# Patient Record
Sex: Male | Born: 1960 | Race: Black or African American | Hispanic: No | Marital: Single | Smoking: Former smoker
Health system: Southern US, Community
[De-identification: ages and names within clinical notes are randomized; demographics above are authoritative.]

## PROBLEM LIST (undated history)

## (undated) DIAGNOSIS — M1990 Unspecified osteoarthritis, unspecified site: Secondary | ICD-10-CM

## (undated) DIAGNOSIS — S79919A Unspecified injury of unspecified hip, initial encounter: Secondary | ICD-10-CM

---

## 1998-08-23 ENCOUNTER — Emergency Department (HOSPITAL_COMMUNITY): Admission: EM | Admit: 1998-08-23 | Discharge: 1998-08-23 | Payer: Self-pay | Admitting: Emergency Medicine

## 1998-08-23 ENCOUNTER — Encounter: Payer: Self-pay | Admitting: Emergency Medicine

## 1999-07-10 ENCOUNTER — Emergency Department (HOSPITAL_COMMUNITY): Admission: EM | Admit: 1999-07-10 | Discharge: 1999-07-10 | Payer: Self-pay | Admitting: Emergency Medicine

## 2009-08-26 ENCOUNTER — Emergency Department (HOSPITAL_COMMUNITY): Admission: EM | Admit: 2009-08-26 | Discharge: 2009-08-26 | Payer: Self-pay | Admitting: Emergency Medicine

## 2016-03-30 ENCOUNTER — Encounter (HOSPITAL_COMMUNITY): Payer: Self-pay

## 2016-03-30 ENCOUNTER — Emergency Department (HOSPITAL_COMMUNITY)
Admission: EM | Admit: 2016-03-30 | Discharge: 2016-03-31 | Disposition: A | Discharge status: Home / Self Care | Payer: Commercial Managed Care - HMO | Attending: Emergency Medicine | Admitting: Emergency Medicine

## 2016-03-30 DIAGNOSIS — I10 Essential (primary) hypertension: Secondary | ICD-10-CM | POA: Diagnosis not present

## 2016-03-30 DIAGNOSIS — R079 Chest pain, unspecified: Secondary | ICD-10-CM | POA: Diagnosis not present

## 2016-03-30 DIAGNOSIS — Z87891 Personal history of nicotine dependence: Secondary | ICD-10-CM | POA: Insufficient documentation

## 2016-03-30 HISTORY — DX: Unspecified osteoarthritis, unspecified site: M19.90

## 2016-03-30 LAB — I-STAT CHEM 8, ED
BUN: 17 mg/dL (ref 6–20)
Calcium, Ion: 1.14 mmol/L — ABNORMAL LOW (ref 1.15–1.40)
Chloride: 102 mmol/L (ref 101–111)
Creatinine, Ser: 1 mg/dL (ref 0.61–1.24)
Glucose, Bld: 87 mg/dL (ref 65–99)
HEMATOCRIT: 44 % (ref 39.0–52.0)
HEMOGLOBIN: 15 g/dL (ref 13.0–17.0)
POTASSIUM: 3.7 mmol/L (ref 3.5–5.1)
SODIUM: 141 mmol/L (ref 135–145)
TCO2: 28 mmol/L (ref 0–100)

## 2016-03-30 LAB — I-STAT TROPONIN, ED: TROPONIN I, POC: 0 ng/mL (ref 0.00–0.08)

## 2016-03-30 MED ORDER — HYDROCHLOROTHIAZIDE 25 MG PO TABS: 25.0000 mg | ORAL_TABLET | Freq: Every day | ORAL | 3 refills | Status: DC

## 2016-03-30 NOTE — ED Notes (Signed)
ED Provider at bedside. 

## 2016-03-30 NOTE — ED Provider Notes (Signed)
[facility] Provider Note   CSN: [id number] Arrival date & time: 03/30/16  1804     History   Chief Complaint Chief Complaint  Patient presents with  . Chest Pain    HPI Gary Velasquez is a 56 y.o. male.  Patient with follow-up visit with a primary care doctor for 2 concerns. Patient's been having difficulty with upper shoulder and neck pain that radiates into both arms for 2 months. Made worse with movement. If he still he has no pain. Patient works 2 jobs. Never had any anterior chest pain. He's noted though his blood pressures been elevated for over 3 weeks. He thinks is due to the prednisone was started on 4 the shoulder pain. Patient was at the doctor's office mostly for concern for the elevated blood pressure they noted EKG changes of ST segment elevation inferiorly and anterolaterally and patient was referred in. Patient's had no anterior chest pain no new pain any different families had for the past 2 months in the upper shoulder area. No shortness of breath no nausea no vomiting. Occasionally has some numbness in the arms.      Past Medical History:  Diagnosis Date  . Arthritis     There are no active problems to display for this patient.   History reviewed. No pertinent surgical history.     Home Medications    Prior to Admission medications   Medication Sig Start Date End Date Taking? Authorizing Provider  chlorhexidine (PERIDEX) 0.12 % solution 15 mLs by Mouth Rinse route every other day.  03/19/16  Yes Historical Provider, MD  etodolac (LODINE) 400 MG tablet Take 400 mg by mouth 2 (two) times daily. 03/03/16  Yes Historical Provider, MD  pantoprazole (PROTONIX) 40 MG tablet Take 40 mg by mouth daily before breakfast. 03/19/16  Yes Historical Provider, MD  predniSONE (DELTASONE) 10 MG tablet Take 10 mg by mouth daily. 03/18/16  Yes Historical Provider, MD  PREVIDENT 5000 BOOSTER PLUS 1.1 % PSTE 1 application daily.  01/20/16  Yes Historical Provider, MD    trolamine salicylate (ASPERCREME) 10 % cream Apply 1-2 application topically See admin instructions. Daily to painful sites as needed   Yes Historical Provider, MD  hydrochlorothiazide (HYDRODIURIL) 25 MG tablet Take 1 tablet (25 mg total) by mouth daily. 03/30/16   Vanetta Mulders, MD    Family History No family history on file.  Social History Social History  Substance Use Topics  . Smoking status: Former Smoker    Packs/day: 0.50    Years: 20.00    Types: Cigarettes    Quit date: 03/25/2016  . Smokeless tobacco: Current User  . Alcohol use No     Allergies   Patient has no known allergies.   Review of Systems Review of Systems  Constitutional: Negative for fever.  HENT: Negative for congestion.   Eyes: Negative for visual disturbance.  Respiratory: Negative for shortness of breath.   Cardiovascular: Negative for chest pain.  Gastrointestinal: Negative for abdominal pain.  Genitourinary: Negative for dysuria.  Musculoskeletal: Positive for back pain and neck pain.  Skin: Negative for rash.  Neurological: Positive for numbness. Negative for dizziness, weakness and headaches.  Hematological: Does not bruise/bleed easily.  Psychiatric/Behavioral: Negative for confusion.     Physical Exam Updated Vital Signs BP 140/98   Pulse 63   Resp 16   Ht 5\' 8"  (1.727 m)   Wt 76.9 kg   SpO2 100%   BMI 25.77 kg/m   Physical Exam  Constitutional: He appears well-developed and well-nourished. No distress.  HENT:  Head: Normocephalic and atraumatic.  Mouth/Throat: Oropharynx is clear and moist.  Eyes: Conjunctivae and EOM are normal. Pupils are equal, round, and reactive to light.  Neck: Normal range of motion. Neck supple.  Cardiovascular: Normal rate, regular rhythm and normal heart sounds.   Pulmonary/Chest: Effort normal and breath sounds normal. No respiratory distress.  Abdominal: Soft. Bowel sounds are normal. There is no tenderness.  Musculoskeletal: Normal range  of motion. He exhibits no edema.  Neurological: He is alert. No cranial nerve deficit or sensory deficit. He exhibits normal muscle tone. Coordination normal.  Skin: Skin is warm.  Nursing note and vitals reviewed.    ED Treatments / Results  Labs (all labs ordered are listed, but only abnormal results are displayed) Labs Reviewed  I-STAT CHEM 8, ED - Abnormal; Notable for the following:       Result Value   Calcium, Ion 1.14 (*)    All other components within normal limits  I-STAT TROPOININ, ED    EKG  EKG Interpretation  Date/Time:  Tuesday March 30 2016 18:13:30 EST Ventricular Rate:  62 PR Interval:    QRS Duration: 90 QT Interval:  385 QTC Calculation: 391 R Axis:   31 Text Interpretation:  Sinus rhythm Borderline ST elevation, anterolateral leads No previous ECGs available Confirmed by Da Michelle  MD, Efrain Clauson ([id number]) on 03/30/2016 6:28:52 PM       Radiology No results found.  Procedures Procedures (including critical care time)  Medications Ordered in ED Medications - No data to display   Initial Impression / Assessment and Plan / ED Course  I have reviewed the triage vital signs and the nursing notes.  Pertinent labs & imaging results that were available during my care of the patient were reviewed by me and considered in my medical decision making (see chart for details).  Clinical Course     EKG was some ST segment elevation. Troponin here negative. Patient's been having upper shoulder chest discomfort for 2 months now. No worsening of the symptoms. With troponin being negative no concerns for an acute cardiac event.  Patient's blood pressure here is been elevated patient reports historically it's been elevated for about 3 weeks. Will start on of hydrochlorothiazide him follow-up with his primary care doctor. Also recommend starting taking a baby aspirin a day and following up with cardiology.  Continue your current medications.  Final Clinical  Impressions(s) / ED Diagnoses   Final diagnoses:  Essential hypertension    New Prescriptions New Prescriptions   HYDROCHLOROTHIAZIDE (HYDRODIURIL) 25 MG TABLET    Take 1 tablet (25 mg total) by mouth daily.     Vanetta MuldersScott Miliana Gangwer, MD 03/30/16 2020

## 2016-03-30 NOTE — Discharge Instructions (Signed)
Blood pressure based on today's readings and historical readings that she reported the consistent with some mild hypertension.  Start taking the hydrochlorothiazide as directed. Follow-up with your doctor in 1 week. Start taking a baby aspirin a day. Make an appointment to follow-up with cardiology for the EKG changes. Today's heart marker negative.  As we discussed your long-standing upper shoulder discomfort doesn't seem to be consistent with an acute cardiac event.

## 2016-03-30 NOTE — ED Triage Notes (Signed)
Pt BIB [facility] from PCP where pt had an apt to be checked r/t high BP. PCP recommended pt come to ED r/t EKG. EKG for EMS shows elevation in lead 2, 3, 4. Pt denies nausea, SOB, and dizziness. Family HX CAD, HTN. Pt was seen 2 mo. Ago at PCP for same pain and swelling in hands. PCP placed pt on prednisone for RA. Pt states pain is worse with movement. Pt presents A&OX4.

## 2016-03-30 NOTE — ED Notes (Signed)
Pt verbalized understanding of DC teaching and medications as well as need to follow up with PCP and cardiologist. Pt being driven home by significant other. NAD. VSS.

## 2016-05-11 DIAGNOSIS — K644 Residual hemorrhoidal skin tags: Secondary | ICD-10-CM | POA: Diagnosis not present

## 2016-05-11 DIAGNOSIS — K5730 Diverticulosis of large intestine without perforation or abscess without bleeding: Secondary | ICD-10-CM | POA: Diagnosis not present

## 2016-05-11 DIAGNOSIS — Z1211 Encounter for screening for malignant neoplasm of colon: Secondary | ICD-10-CM | POA: Diagnosis not present

## 2016-05-14 ENCOUNTER — Encounter (INDEPENDENT_AMBULATORY_CARE_PROVIDER_SITE_OTHER): Payer: Self-pay | Admitting: Physical Medicine and Rehabilitation

## 2016-05-19 ENCOUNTER — Encounter (INDEPENDENT_AMBULATORY_CARE_PROVIDER_SITE_OTHER): Payer: Self-pay | Admitting: Physical Medicine and Rehabilitation

## 2016-05-19 ENCOUNTER — Ambulatory Visit (INDEPENDENT_AMBULATORY_CARE_PROVIDER_SITE_OTHER): Payer: Commercial Managed Care - HMO | Admitting: Physical Medicine and Rehabilitation

## 2016-05-19 DIAGNOSIS — R202 Paresthesia of skin: Secondary | ICD-10-CM | POA: Diagnosis not present

## 2016-05-19 DIAGNOSIS — G8929 Other chronic pain: Secondary | ICD-10-CM | POA: Diagnosis not present

## 2016-05-19 DIAGNOSIS — M25512 Pain in left shoulder: Secondary | ICD-10-CM | POA: Diagnosis not present

## 2016-05-19 NOTE — Progress Notes (Signed)
Gary Velasquez - 56 y.o. male MRN [id number]  Date of birth: 12/09/1960  Office Visit Note: Visit Date: 05/19/2016 PCP: Geraldo PitterBLAND,VEITA J, MD Referred by: Renaye RakersBland, Veita, MD  Subjective: Chief Complaint  Patient presents with  . Left Arm - Pain  . Left Hand - Numbness   HPI: Mr. Gary Velasquez is a 56 year old right-hand dominant who is present today at the request of Dr. Parke SimmersBland for electrodiagnostic study of the bilateral hands. He reports symptoms starting around 4 months ago without specific injury. He states that he is having left shoulder pain that goes across his upper back to the right side and then numbness in both hands. He reports his left hand is actually worse than the right in terms of numbness. He states that the numbness is in all the digits on the left hand only on the thumbs bilaterally. He gets more symptoms reaching and turning over in bed in terms of his shoulder pain. His numbness is fairly constant. He's not had prior electrodiagnostic studies. He does report prior injury to the right thumb playing football in college. 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 He has been treated for rheumatoid arthritis by Dr. Parke SimmersBland. Not seen a rheumatologist. He does not have numbness and tingling in a specific dermatomal or peripheral nerve distribution. He has not had prior diagnosis of carpal tunnel syndrome or peripheral polyneuropathy. He does not carry a diagnosis of diabetes. He has not had an MRI or imaging of the cervical spine.  Review of Systems  Constitutional: Negative for chills, fever, malaise/fatigue and weight loss.  HENT: Negative for hearing loss and sinus pain.   Eyes: Negative for blurred vision, double vision and photophobia.  Respiratory: Negative for cough and shortness of breath.   Cardiovascular: Negative for chest pain, palpitations and leg swelling.  Gastrointestinal: Negative for abdominal pain, nausea and vomiting.  Genitourinary: Negative for flank  pain.  Musculoskeletal: Positive for joint pain and neck pain. Negative for myalgias.  Skin: Negative for itching and rash.  Neurological: Positive for tingling, sensory change and weakness. Negative for tremors and focal weakness.  Endo/Heme/Allergies: Negative.   Psychiatric/Behavioral: Negative for depression.  All other systems reviewed and are negative.  Otherwise per HPI.  Assessment & Plan: Visit Diagnoses:  1. Paresthesia of skin   2. Chronic left shoulder pain     Plan: No additional findings.  EMG & NCV Findings: All nerve conduction studies (as indicated in the following tables) were within normal limits.  All left vs. right side differences were within normal limits.    All examined muscles (as indicated in the following table) showed no evidence of electrical instability.    Impression: Essentially NORMAL electrodiagnostic study of both upper limbs.    There is no significant electrodiagnostic evidence of nerve entrapment, brachial plexopathy, cervical radiculopathy or generalized peripheral neuropathy.    **As you know, purely sensory or demyelinating radiculopathies and chemical radiculitis may not be detected with this particular electrodiagnostic study.  Recommendations: 1.  Follow-up with referring physician. Consider rheumatology referral given the deformities of the thumbs. 2.  Continue current management of symptoms. Shoulder/arm pain may benefit from focused physical therapy. 3.  Suggest MRI of the cervical spine ifthis is felt to be more radicular type symptoms with paresthesia in the left more than right arm.This test does not rule out radicular pain as noted above in the impression.   Meds & Orders: No orders of the defined types were placed in this encounter.  Orders Placed This Encounter  Procedures  . NCV with EMG (electromyography)    Follow-up: Return for Scheduled appointment with Dr. Parke Simmers.   Procedures: No procedures performed  No notes on  file   Clinical History: No specialty comments available.  He reports that he quit smoking about 8 weeks ago. His smoking use included Cigarettes. He has a 10.00 pack-year smoking history. He uses smokeless tobacco. No results for input(s): HGBA1C, LABURIC in the last 8760 hours.  Objective:  VS:  HT:    WT:   BMI:     BP:   HR: bpm  TEMP: ( )  RESP:  Physical Exam  Constitutional: He is oriented to person, place, and time. He appears well-developed and well-nourished. No distress.  HENT:  Head: Normocephalic and atraumatic.  Nose: Nose normal.  Mouth/Throat: Oropharynx is clear and moist.  Eyes: Conjunctivae are normal. Pupils are equal, round, and reactive to light.  Neck: Normal range of motion. Neck supple.  Cardiovascular: Regular rhythm and intact distal pulses.   Pulmonary/Chest: Effort normal and breath sounds normal. No respiratory distress.  Abdominal: Soft. He exhibits no distension.  Musculoskeletal: He exhibits no deformity.  Inspection reveals bilateral hitchhiker's thumb deformities but no atrophy of the bilateral APB or FDI or hand intrinsics. There is no swelling, color changes, allodynia or dystrophic changes. There is no active synovitis noted. There is 5 out of 5 strength in the bilateral wrist extension, finger abduction and long finger flexion. There is subjective decreased sensation to light touch in the left hand but it is nondermatomal. Froment's test is equivocal and hard to test due to thumb deformity.. There is a negative Tinel's test at the bilateral wrist and elbow.  There is a negative Hoffmann's test bilaterally. Cervical spine range of motion is limited in ranges with rotation and extension. There are no shoulder impingement signs.  Neurological: He is alert and oriented to person, place, and time. He exhibits abnormal muscle tone. Coordination normal.  Skin: Skin is warm and dry. No rash noted. No erythema.  Psychiatric: He has a normal mood and affect.  His behavior is normal.  Nursing note and vitals reviewed.   Ortho Exam Imaging: No results found.  Past Medical/Family/Surgical/Social History: Medications & Allergies reviewed per EMR There are no active problems to display for this patient.  Past Medical History:  Diagnosis Date  . Arthritis    History reviewed. No pertinent family history. History reviewed. No pertinent surgical history. Social History   Occupational History  . Not on file.   Social History Main Topics  . Smoking status: Former Smoker    Packs/day: 0.50    Years: 20.00    Types: Cigarettes    Quit date: 03/25/2016  . Smokeless tobacco: Current User  . Alcohol use No  . Drug use: Yes    Types: Marijuana  . Sexual activity: Not on file

## 2016-05-20 NOTE — Procedures (Signed)
EMG & NCV Findings: All nerve conduction studies (as indicated in the following tables) were within normal limits.  All left vs. right side differences were within normal limits.    All examined muscles (as indicated in the following table) showed no evidence of electrical instability.    Impression: Essentially NORMAL electrodiagnostic study of both upper limbs.    There is no significant electrodiagnostic evidence of nerve entrapment, brachial plexopathy, cervical radiculopathy or generalized peripheral neuropathy.    As you know, purely sensory or demyelinating radiculopathies and chemical radiculitis may not be detected with this particular electrodiagnostic study.  Recommendations: 1.  Follow-up with referring physician. Consider rheumatology referral given the deformities of the thumbs. 2.  Continue current management of symptoms. Shoulder arm pain may benefit from focused physical therapy. 3.  Suggest MRI of the cervical spine at this is felt to be more radicular type symptoms with paresthesia in the left more than right arm.      Nerve Conduction Studies Anti Sensory Summary Table   Stim Site NR Peak (ms) Norm Peak (ms) P-T Amp (V) Norm P-T Amp Site1 Site2 Delta-P (ms) Dist (cm) Vel (m/s) Norm Vel (m/s)  Left Median Acr Palm Anti Sensory (2nd Digit)  33.1C  Wrist    3.3 <3.6 22.9 >10 Wrist Palm 1.6 0.0    Palm    1.7 <2.0 13.9         Right Median Acr Palm Anti Sensory (2nd Digit)  33.4C  Wrist    3.5 <3.6 16.0 >10        Site 3    3.3  7.5         Left Radial Anti Sensory (Base 1st Digit)  33C  Wrist    2.0 <3.1 24.8  Wrist Base 1st Digit 2.0 0.0    Right Radial Anti Sensory (Base 1st Digit)  32.7C  Wrist    2.1 <3.1 8.8  Wrist Base 1st Digit 2.1 0.0    Left Ulnar Anti Sensory (5th Digit)  33.4C  Wrist    3.2 <3.7 16.5 >15.0 Wrist 5th Digit 3.2 14.0 44 >38  Right Ulnar Anti Sensory (5th Digit)  33.2C  Wrist    3.0 <3.7 17.5 >15.0 Wrist 5th Digit 3.0 14.0 47 >38    Motor Summary Table   Stim Site NR Onset (ms) Norm Onset (ms) O-P Amp (mV) Norm O-P Amp Site1 Site2 Delta-0 (ms) Dist (cm) Vel (m/s) Norm Vel (m/s)  Left Median Motor (Abd Poll Brev)  33.4C  Wrist    3.2 <4.2 10.9 >5 Elbow Wrist 4.5 24.5 54 >50  Elbow    7.7  10.4         Right Median Motor (Abd Poll Brev)  32.7C  Wrist    3.4 <4.2 9.2 >5 Elbow Wrist 4.4 23.0 52 >50  Elbow    7.8  8.6         Left Ulnar Motor (Abd Dig Min)  33.3C  Wrist    2.9 <4.2 12.3 >3 B Elbow Wrist 4.0 22.5 56 >53  B Elbow    6.9  10.1  A Elbow B Elbow 1.5 10.0 67 >53  A Elbow    8.4  9.4         Right Ulnar Motor (Abd Dig Min)  33C  Wrist    2.7 <4.2 10.7 >3 B Elbow Wrist 4.0 21.5 54 >53  B Elbow    6.7  9.2  A Elbow B Elbow 1.7 10.0 59 >  53  A Elbow    8.4  8.2          EMG   Side Muscle Nerve Root Ins Act Fibs Psw Amp Dur Poly Recrt Int Dennie Bible Comment  Left Abd Poll Brev Median C8-T1 Nml Nml Nml Nml Nml 0 Nml Nml   Left 1stDorInt Ulnar C8-T1 Nml Nml Nml Nml Nml 0 Nml Nml   Left PronatorTeres Median C6-7 Nml Nml Nml Nml Nml 0 Nml Nml   Left Biceps Musculocut C5-6 Nml Nml Nml Nml Nml 0 Nml Nml   Left Deltoid Axillary C5-6 Nml Nml Nml Nml Nml 0 Nml Nml     Nerve Conduction Studies Anti Sensory Left/Right Comparison   Stim Site L Lat (ms) R Lat (ms) L-R Lat (ms) L Amp (V) R Amp (V) L-R Amp (%) Site1 Site2 L Vel (m/s) R Vel (m/s) L-R Vel (m/s)  Median Acr Palm Anti Sensory (2nd Digit)  33.1C  Wrist 3.3 3.5 0.2 22.9 16.0 30.1 Wrist Palm     Palm 1.7   13.9         Radial Anti Sensory (Base 1st Digit)  33C  Wrist 2.0 2.1 0.1 24.8 8.8 64.5 Wrist Base 1st Digit     Ulnar Anti Sensory (5th Digit)  33.4C  Wrist 3.2 3.0 0.2 16.5 17.5 5.7 Wrist 5th Digit 44 47 3   Motor Left/Right Comparison   Stim Site L Lat (ms) R Lat (ms) L-R Lat (ms) L Amp (mV) R Amp (mV) L-R Amp (%) Site1 Site2 L Vel (m/s) R Vel (m/s) L-R Vel (m/s)  Median Motor (Abd Poll Brev)  33.4C  Wrist 3.2 3.4 0.2 10.9 9.2 15.6 Elbow Wrist  54 52 2  Elbow 7.7 7.8 0.1 10.4 8.6 17.3       Ulnar Motor (Abd Dig Min)  33.3C  Wrist 2.9 2.7 0.2 12.3 10.7 13.0 B Elbow Wrist 56 54 2  B Elbow 6.9 6.7 0.2 10.1 9.2 8.9 A Elbow B Elbow 67 59 8  A Elbow 8.4 8.4 0.0 9.4 8.2 12.8

## 2016-05-25 ENCOUNTER — Encounter (INDEPENDENT_AMBULATORY_CARE_PROVIDER_SITE_OTHER): Payer: Self-pay | Admitting: Physical Medicine and Rehabilitation

## 2016-06-18 DIAGNOSIS — M79602 Pain in left arm: Secondary | ICD-10-CM | POA: Diagnosis not present

## 2016-06-29 DIAGNOSIS — Z1211 Encounter for screening for malignant neoplasm of colon: Secondary | ICD-10-CM | POA: Diagnosis not present

## 2016-06-29 DIAGNOSIS — K219 Gastro-esophageal reflux disease without esophagitis: Secondary | ICD-10-CM | POA: Diagnosis not present

## 2016-06-30 ENCOUNTER — Other Ambulatory Visit: Payer: Self-pay | Admitting: Family Medicine

## 2016-06-30 DIAGNOSIS — M25519 Pain in unspecified shoulder: Secondary | ICD-10-CM

## 2016-07-02 DIAGNOSIS — M542 Cervicalgia: Secondary | ICD-10-CM | POA: Diagnosis not present

## 2016-07-02 DIAGNOSIS — M5412 Radiculopathy, cervical region: Secondary | ICD-10-CM | POA: Diagnosis not present

## 2016-07-06 DIAGNOSIS — M5412 Radiculopathy, cervical region: Secondary | ICD-10-CM | POA: Diagnosis not present

## 2016-07-06 DIAGNOSIS — M542 Cervicalgia: Secondary | ICD-10-CM | POA: Diagnosis not present

## 2016-07-08 DIAGNOSIS — M5412 Radiculopathy, cervical region: Secondary | ICD-10-CM | POA: Diagnosis not present

## 2016-07-08 DIAGNOSIS — M542 Cervicalgia: Secondary | ICD-10-CM | POA: Diagnosis not present

## 2016-07-12 ENCOUNTER — Other Ambulatory Visit: Payer: Commercial Managed Care - HMO

## 2016-07-13 ENCOUNTER — Inpatient Hospital Stay
Admission: RE | Admit: 2016-07-13 | Discharge: 2016-07-13 | Disposition: A | Discharge status: Home / Self Care | Payer: Commercial Managed Care - HMO | Source: Ambulatory Visit | Attending: Family Medicine | Admitting: Family Medicine

## 2016-07-14 DIAGNOSIS — M5412 Radiculopathy, cervical region: Secondary | ICD-10-CM | POA: Diagnosis not present

## 2016-07-14 DIAGNOSIS — M542 Cervicalgia: Secondary | ICD-10-CM | POA: Diagnosis not present

## 2016-07-16 DIAGNOSIS — M5412 Radiculopathy, cervical region: Secondary | ICD-10-CM | POA: Diagnosis not present

## 2016-07-16 DIAGNOSIS — M542 Cervicalgia: Secondary | ICD-10-CM | POA: Diagnosis not present

## 2016-07-19 DIAGNOSIS — M5412 Radiculopathy, cervical region: Secondary | ICD-10-CM | POA: Diagnosis not present

## 2016-07-19 DIAGNOSIS — M542 Cervicalgia: Secondary | ICD-10-CM | POA: Diagnosis not present

## 2016-07-21 DIAGNOSIS — M542 Cervicalgia: Secondary | ICD-10-CM | POA: Diagnosis not present

## 2016-07-21 DIAGNOSIS — M5412 Radiculopathy, cervical region: Secondary | ICD-10-CM | POA: Diagnosis not present

## 2016-10-29 DIAGNOSIS — H43393 Other vitreous opacities, bilateral: Secondary | ICD-10-CM | POA: Diagnosis not present

## 2016-10-29 DIAGNOSIS — M059 Rheumatoid arthritis with rheumatoid factor, unspecified: Secondary | ICD-10-CM | POA: Diagnosis not present

## 2016-11-23 DIAGNOSIS — M79641 Pain in right hand: Secondary | ICD-10-CM | POA: Diagnosis not present

## 2016-11-23 DIAGNOSIS — M2550 Pain in unspecified joint: Secondary | ICD-10-CM | POA: Diagnosis not present

## 2016-11-23 DIAGNOSIS — M79642 Pain in left hand: Secondary | ICD-10-CM | POA: Diagnosis not present

## 2016-11-23 DIAGNOSIS — R5382 Chronic fatigue, unspecified: Secondary | ICD-10-CM | POA: Diagnosis not present

## 2016-11-23 DIAGNOSIS — M25511 Pain in right shoulder: Secondary | ICD-10-CM | POA: Diagnosis not present

## 2016-12-28 DIAGNOSIS — M79642 Pain in left hand: Secondary | ICD-10-CM | POA: Diagnosis not present

## 2016-12-28 DIAGNOSIS — M79641 Pain in right hand: Secondary | ICD-10-CM | POA: Diagnosis not present

## 2016-12-28 DIAGNOSIS — M2550 Pain in unspecified joint: Secondary | ICD-10-CM | POA: Diagnosis not present

## 2017-06-07 ENCOUNTER — Encounter (HOSPITAL_COMMUNITY): Payer: Self-pay | Admitting: Family Medicine

## 2017-06-07 ENCOUNTER — Ambulatory Visit (HOSPITAL_COMMUNITY)
Admission: EM | Admit: 2017-06-07 | Discharge: 2017-06-07 | Disposition: A | Discharge status: Home / Self Care | Payer: 59 | Attending: Family Medicine | Admitting: Family Medicine

## 2017-06-07 DIAGNOSIS — M546 Pain in thoracic spine: Secondary | ICD-10-CM

## 2017-06-07 MED ORDER — CYCLOBENZAPRINE HCL 10 MG PO TABS: 5.0000 mg | ORAL_TABLET | Freq: Every evening | ORAL | 0 refills | Status: DC | PRN

## 2017-06-07 MED ORDER — KETOROLAC TROMETHAMINE 60 MG/2ML IM SOLN
60.0000 mg | Freq: Once | INTRAMUSCULAR | Status: AC
  Administered 2017-06-07: 60 mg via INTRAMUSCULAR

## 2017-06-07 MED ORDER — MELOXICAM 7.5 MG PO TABS: 7.5000 mg | ORAL_TABLET | Freq: Every day | ORAL | 0 refills | Status: DC

## 2017-06-07 MED ORDER — PREDNISONE 20 MG PO TABS: 40.0000 mg | ORAL_TABLET | Freq: Every day | ORAL | 0 refills | Status: AC

## 2017-06-07 MED ORDER — KETOROLAC TROMETHAMINE 60 MG/2ML IM SOLN
INTRAMUSCULAR | Status: AC
  Filled 2017-06-07: qty 2

## 2017-06-07 NOTE — ED Triage Notes (Signed)
Pt here for back pain since Saturday. Denies injury. sts that Saturday he was "playing around" on Saturday. sts mid back and taking aleve and Vicodin with little relief.

## 2017-06-07 NOTE — ED Provider Notes (Signed)
[facility]    CSN: [id number] Arrival date & time: 06/07/17  1531     History   Chief Complaint Chief Complaint  Patient presents with  . Back Pain    HPI Gary Velasquez is a 57 y.o. male.   57 year old male comes in for 3-day history of back pain.  States that he was "playing around" when symptoms first started. Denies injury or trauma. States that it is across the mid back with pulling sensation.  It is constant, worsens with movement.  He has been taking Aleve 220 mg 4 times a day with slight relief.  States he took leftover Vicodin with some relief, and was able to come in for evaluation.  He denies any numbness, tingling, saddle anesthesia, loss of bladder or bowel control.  Denies urinary symptoms such as frequency, dysuria, hematuria.  Denies fever, chills, night sweats.  States that he did have injury to the back in the past, but does not have regular back pain.      Past Medical History:  Diagnosis Date  . Arthritis     There are no active problems to display for this patient.   History reviewed. No pertinent surgical history.     Home Medications    Prior to Admission medications   Medication Sig Start Date End Date Taking? Authorizing Provider  chlorhexidine (PERIDEX) 0.12 % solution 15 mLs by Mouth Rinse route every other day.  03/19/16   [provider]  cyclobenzaprine (FLEXERIL) 10 MG tablet Take 0.5-1 tablets (5-10 mg total) by mouth at bedtime as needed for muscle spasms. 06/07/17   Belinda FisherYu, Amy V, PA-C  etodolac (LODINE) 400 MG tablet Take 400 mg by mouth 2 (two) times daily. 03/03/16   [provider]  hydrochlorothiazide (HYDRODIURIL) 25 MG tablet Take 1 tablet (25 mg total) by mouth daily. Patient not taking: Reported on 05/19/2016 03/30/16   Vanetta MuldersZackowski, Scott, MD  meloxicam (MOBIC) 7.5 MG tablet Take 1 tablet (7.5 mg total) by mouth daily. 06/07/17   Cathie HoopsYu, Amy V, PA-C  pantoprazole (PROTONIX) 40 MG tablet Take 40 mg by mouth daily  before breakfast. 03/19/16   [provider]  predniSONE (DELTASONE) 20 MG tablet Take 2 tablets (40 mg total) by mouth daily for 3 days. 06/07/17 06/10/17  Cathie HoopsYu, Amy V, PA-C  PREVIDENT 5000 BOOSTER PLUS 1.1 % PSTE 1 application daily.  01/20/16   [provider]  trolamine salicylate (ASPERCREME) 10 % cream Apply 1-2 application topically See admin instructions. Daily to painful sites as needed    [provider]    Family History History reviewed. No pertinent family history.  Social History Social History   Tobacco Use  . Smoking status: Former Smoker    Packs/day: 0.50    Years: 20.00    Pack years: 10.00    Types: Cigarettes    Last attempt to quit: 03/25/2016    Years since quitting: 1.2  . Smokeless tobacco: Current User  Substance Use Topics  . Alcohol use: No  . Drug use: Yes    Types: Marijuana     Allergies   Patient has no known allergies.   Review of Systems Review of Systems  Reason unable to perform ROS: See HPI as above.     Physical Exam Triage Vital Signs ED Triage Vitals  Enc Vitals Group     BP 06/07/17 1720 136/83     Pulse Rate 06/07/17 1720 79     Resp 06/07/17 1720 18  Temp 06/07/17 1720 98.1 F (36.7 C)     Temp src --      SpO2 06/07/17 1720 98 %     Weight --      Height --      Head Circumference --      Peak Flow --      Pain Score 06/07/17 1719 9     Pain Loc --      Pain Edu? --      Excl. in GC? --    No data found.  Updated Vital Signs BP 136/83   Pulse 79   Temp 98.1 F (36.7 C)   Resp 18   SpO2 98%   Physical Exam  Constitutional: He is oriented to person, place, and time. He appears well-developed and well-nourished. No distress.  HENT:  Head: Normocephalic and atraumatic.  Eyes: Conjunctivae are normal. Pupils are equal, round, and reactive to light.  Cardiovascular: Normal rate, regular rhythm and normal heart sounds. Exam reveals no gallop and no friction rub.  No murmur  heard. Pulmonary/Chest: Effort normal and breath sounds normal. He has no wheezes. He has no rales.  Musculoskeletal:  Diffuse tenderness of thoracic back. Full range of motion. Strength normal and equal bilaterally. Sensation intact and equal bilaterally. Negative straight leg raise.  Radial pulses 2+ and equal bilaterally. Capillary refill less than 2 seconds.   Neurological: He is alert and oriented to person, place, and time.  Skin: Skin is warm and dry.     UC Treatments / Results  Labs (all labs ordered are listed, but only abnormal results are displayed) Labs Reviewed - No data to display  EKG  EKG Interpretation None       Radiology No results found.  Procedures Procedures (including critical care time)  Medications Ordered in UC Medications  ketorolac (TORADOL) injection 60 mg (60 mg Intramuscular Given 06/07/17 1814)     Initial Impression / Assessment and Plan / UC Course  I have reviewed the triage vital signs and the nursing notes.  Pertinent labs & imaging results that were available during my care of the patient were reviewed by me and considered in my medical decision making (see chart for details).    Diffuse thoracic back pain that is atraumatic, no indications for imaging today.  Toradol injection in office today.  Mobic and prednisone as directed.  Muscle relaxant as needed.  Ice/heat compresses.  Return precautions given.  Patient expresses understanding and agrees to plan.  Final Clinical Impressions(s) / UC Diagnoses   Final diagnoses:  Acute bilateral thoracic back pain    ED Discharge Orders        Ordered    predniSONE (DELTASONE) 20 MG tablet  Daily     06/07/17 1808    meloxicam (MOBIC) 7.5 MG tablet  Daily     06/07/17 1808    cyclobenzaprine (FLEXERIL) 10 MG tablet  At bedtime PRN     06/07/17 1808        Belinda Fisher, PA-C 06/07/17 1822

## 2017-06-07 NOTE — Discharge Instructions (Signed)
Toradol injection in office today. Start Mobic as directed. Prednisone as directed. Flexeril as needed at night. Flexeril can make you drowsy, so do not take if you are going to drive, operate heavy machinery, or make important decisions. Ice/heat compresses as needed. Follow up with PCP for further evaluation if symptoms not improving. If experience numbness/tingling of the inner thighs, loss of bladder or bowel control, go to the emergency department for evaluation.

## 2017-07-13 ENCOUNTER — Ambulatory Visit (INDEPENDENT_AMBULATORY_CARE_PROVIDER_SITE_OTHER): Payer: 59

## 2017-07-13 ENCOUNTER — Ambulatory Visit (HOSPITAL_COMMUNITY)
Admission: EM | Admit: 2017-07-13 | Discharge: 2017-07-13 | Disposition: A | Discharge status: Home / Self Care | Payer: 59 | Attending: Family Medicine | Admitting: Family Medicine

## 2017-07-13 ENCOUNTER — Other Ambulatory Visit: Payer: Self-pay

## 2017-07-13 ENCOUNTER — Encounter (HOSPITAL_COMMUNITY): Payer: Self-pay | Admitting: Emergency Medicine

## 2017-07-13 DIAGNOSIS — R52 Pain, unspecified: Secondary | ICD-10-CM | POA: Diagnosis not present

## 2017-07-13 DIAGNOSIS — M7061 Trochanteric bursitis, right hip: Secondary | ICD-10-CM

## 2017-07-13 DIAGNOSIS — S72114A Nondisplaced fracture of greater trochanter of right femur, initial encounter for closed fracture: Secondary | ICD-10-CM | POA: Diagnosis not present

## 2017-07-13 MED ORDER — HYDROCODONE-ACETAMINOPHEN 5-325 MG PO TABS: 1.0000 | ORAL_TABLET | Freq: Four times a day (QID) | ORAL | 0 refills | Status: DC | PRN

## 2017-07-13 NOTE — Discharge Instructions (Addendum)
The x-rays do not show a fracture.  No work until rechecked by your personal doctor, or return here in 2 days.

## 2017-07-13 NOTE — ED Provider Notes (Signed)
[facility]   [id number] 07/13/17 Arrival Time: 1402   SUBJECTIVE:  Hansel Feinsteinllan R Chakraborty is a [age] y.o. male who presents to the urgent care with complaint of fell on Sunday, patient tripped over the edge of bed.  Patient says pain continues and unable to put weight on right hip.    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 He says that he feels discomfort over the trochanter as well as in the groin.  Patient reports no other injuries.  Past Medical History:  Diagnosis Date  . Arthritis    Family History  Problem Relation Age of Onset  . Hypertension Father    Social History   Socioeconomic History  . Marital status: Single    Spouse name: Not on file  . Number of children: Not on file  . Years of education: Not on file  . Highest education level: Not on file  Occupational History  . Not on file  Social Needs  . Financial resource strain: Not on file  . Food insecurity:    Worry: Not on file    Inability: Not on file  . Transportation needs:    Medical: Not on file    Non-medical: Not on file  Tobacco Use  . Smoking status: Former Smoker    Packs/day: 0.50    Years: 20.00    Pack years: 10.00    Types: Cigarettes    Last attempt to quit: 03/25/2016    Years since quitting: 1.3  . Smokeless tobacco: Current User  Substance and Sexual Activity  . Alcohol use: No  . Drug use: Yes    Types: Marijuana  . Sexual activity: Not on file  Lifestyle  . Physical activity:    Days per week: Not on file    Minutes per session: Not on file  . Stress: Not on file  Relationships  . Social connections:    Talks on phone: Not on file    Gets together: Not on file    Attends religious service: Not on file    Active member of club or organization: Not on file    Attends meetings of clubs or organizations: Not on file    Relationship status: Not on file  . Intimate partner violence:    Fear of current  or ex partner: Not on file    Emotionally abused: Not on file    Physically abused: Not on file    Forced sexual activity: Not on file  Other Topics Concern  . Not on file  Social History Narrative  . Not on file   Current Meds  Medication Sig  . meloxicam (MOBIC) 7.5 MG tablet Take 1 tablet (7.5 mg total) by mouth daily.  . pantoprazole (PROTONIX) 40 MG tablet Take 40 mg by mouth daily before breakfast.   No Known Allergies    ROS: As per HPI, remainder of ROS negative.   OBJECTIVE:   Vitals:   07/13/17 1422 07/13/17 1425  BP: (!) 163/103 (!) 142/96  Pulse: 77   Resp: 18   Temp: 98.5 F (36.9 C)   TempSrc: Oral   SpO2: 99%      General appearance: alert; no distress Eyes: PERRL; EOMI; conjunctiva normal HENT: normocephalic; atraumatic;oral mucosa normal Neck: supple Lungs: clear to auscultation bilaterally Heart: regular rate and rhythm Abdomen: soft, non-tender; bowel sounds normal; no masses or organomegaly; no guarding or rebound tenderness Back:  no CVA tenderness Extremities: no cyanosis or edema; symmetrical with no gross deformities; right hip: good range of motion when patient is supine, tender right trochanter Skin: warm and dry Neurologic: normal gait; grossly normal Psychological: alert and cooperative; normal mood and affect      Labs:  Results for orders placed or performed during the hospital encounter of 03/30/16  I-stat troponin, ED  Result Value Ref Range   Troponin i, poc 0.00 0.00 - 0.08 ng/mL   Comment 3          I-stat Chem 8, ED  Result Value Ref Range   Sodium 141 135 - 145 mmol/L   Potassium 3.7 3.5 - 5.1 mmol/L   Chloride 102 101 - 111 mmol/L   BUN 17 6 - 20 mg/dL   Creatinine, Ser 6.04 0.61 - 1.24 mg/dL   Glucose, Bld 87 65 - 99 mg/dL   Calcium, Ion 5.40 (L) 1.15 - 1.40 mmol/L   TCO2 28 0 - 100 mmol/L   Hemoglobin 15.0 13.0 - 17.0 g/dL   HCT 98.1 19.1 - 47.8 %    Labs Reviewed - No data to display  No results  found.     ASSESSMENT & PLAN:  1. Trochanteric bursitis of right hip   2. Pain     Meds ordered this encounter  Medications  . HYDROcodone-acetaminophen (NORCO) 5-325 MG tablet    Sig: Take 1 tablet by mouth every 6 (six) hours as needed for moderate pain.    Dispense:  12 tablet    Refill:  0    Reviewed expectations re: course of current medical issues. Questions answered. Outlined signs and symptoms indicating need for more acute intervention. Patient verbalized understanding. After Visit Summary given.    Procedures:      Elvina Sidle, MD 07/13/17 1506

## 2017-07-13 NOTE — ED Triage Notes (Signed)
Patient fell on Sunday, patient tripped over the edge of bed.  Patient says pain continues and unable to put weight on right hip.

## 2017-07-18 ENCOUNTER — Encounter (HOSPITAL_COMMUNITY): Payer: Self-pay | Admitting: Family Medicine

## 2017-07-18 ENCOUNTER — Emergency Department (HOSPITAL_COMMUNITY)
Admission: EM | Admit: 2017-07-18 | Discharge: 2017-07-19 | Disposition: A | Discharge status: Home / Self Care | Payer: 59 | Attending: Emergency Medicine | Admitting: Emergency Medicine

## 2017-07-18 ENCOUNTER — Emergency Department (HOSPITAL_COMMUNITY): Payer: 59

## 2017-07-18 ENCOUNTER — Ambulatory Visit (HOSPITAL_COMMUNITY)
Admission: EM | Admit: 2017-07-18 | Discharge: 2017-07-18 | Disposition: A | Discharge status: Home / Self Care | Payer: 59 | Source: Home / Self Care

## 2017-07-18 ENCOUNTER — Encounter (HOSPITAL_COMMUNITY): Payer: Self-pay

## 2017-07-18 DIAGNOSIS — S72114A Nondisplaced fracture of greater trochanter of right femur, initial encounter for closed fracture: Secondary | ICD-10-CM | POA: Insufficient documentation

## 2017-07-18 DIAGNOSIS — W19XXXA Unspecified fall, initial encounter: Secondary | ICD-10-CM | POA: Insufficient documentation

## 2017-07-18 DIAGNOSIS — Z79899 Other long term (current) drug therapy: Secondary | ICD-10-CM | POA: Insufficient documentation

## 2017-07-18 DIAGNOSIS — Y999 Unspecified external cause status: Secondary | ICD-10-CM | POA: Diagnosis not present

## 2017-07-18 DIAGNOSIS — Y92003 Bedroom of unspecified non-institutional (private) residence as the place of occurrence of the external cause: Secondary | ICD-10-CM | POA: Diagnosis not present

## 2017-07-18 DIAGNOSIS — F17290 Nicotine dependence, other tobacco product, uncomplicated: Secondary | ICD-10-CM | POA: Insufficient documentation

## 2017-07-18 DIAGNOSIS — Y939 Activity, unspecified: Secondary | ICD-10-CM | POA: Diagnosis not present

## 2017-07-18 DIAGNOSIS — M25551 Pain in right hip: Secondary | ICD-10-CM

## 2017-07-18 DIAGNOSIS — S79911A Unspecified injury of right hip, initial encounter: Secondary | ICD-10-CM | POA: Diagnosis present

## 2017-07-18 NOTE — ED Provider Notes (Signed)
[facility]   [id number] 07/18/17 Arrival Time: 1435   SUBJECTIVE:  Gary Velasquez is a 57 y.o. male who presents to the urgent care with complaint of right hip pain which began over a week ago when he stumbled in the dark at home going around his bed.  He works for the [employer] and has been unable to bear weight, hence work.  He is using a cane to get around.    No improvement since his last visit, when he was given pain medication.     Past Medical History:  Diagnosis Date  . Arthritis    Family History  Problem Relation Age of Onset  . Hypertension Father    Social History   Socioeconomic History  . Marital status: Single    Spouse name: Not on file  . Number of children: Not on file  . Years of education: Not on file  . Highest education level: Not on file  Occupational History  . Not on file  Social Needs  . Financial resource strain: Not on file  . Food insecurity:    Worry: Not on file    Inability: Not on file  . Transportation needs:    Medical: Not on file    Non-medical: Not on file  Tobacco Use  . Smoking status: Former Smoker    Packs/day: 0.50    Years: 20.00    Pack years: 10.00    Types: Cigarettes    Last attempt to quit: 03/25/2016    Years since quitting: 1.3  . Smokeless tobacco: Current User  Substance and Sexual Activity  . Alcohol use: No  . Drug use: Yes    Types: Marijuana  . Sexual activity: Not on file  Lifestyle  . Physical activity:    Days per week: Not on file    Minutes per session: Not on file  . Stress: Not on file  Relationships  . Social connections:    Talks on phone: Not on file    Gets together: Not on file    Attends religious service: Not on file    Active member of club or organization: Not on file    Attends meetings of clubs or organizations: Not on file    Relationship status: Not on file  . Intimate partner violence:    Fear of current or ex partner: Not on file    Emotionally  abused: Not on file    Physically abused: Not on file    Forced sexual activity: Not on file  Other Topics Concern  . Not on file  Social History Narrative  . Not on file   No outpatient medications have been marked as taking for the 07/18/17 encounter [facility] Encounter).   No Known Allergies    ROS: As per HPI, remainder of ROS negative.   OBJECTIVE:   Vitals:   07/18/17 1517  BP: 113/73  Pulse: 74  Resp: 18  SpO2: 98%     General appearance: alert; no distress Eyes: PERRL; EOMI; conjunctiva normal HENT: normocephalic; atraumatic;  oral mucosa normal Neck: supple Back: no CVA tenderness Extremities: no cyanosis or edema; symmetrical with no gross deformities;  Extreme pain now with internal rotation of the right hip. Skin: warm and dry Neurologic:  grossly normal upper extremity and left lower extremity movement. Psychological: alert and cooperative; normal mood and affect      Labs:  Results for orders placed or performed during the hospital encounter of 03/30/16  I-stat troponin, ED  Result Value Ref Range   Troponin i, poc 0.00 0.00 - 0.08 ng/mL   Comment 3          I-stat Chem 8, ED  Result Value Ref Range   Sodium 141 135 - 145 mmol/L   Potassium 3.7 3.5 - 5.1 mmol/L   Chloride 102 101 - 111 mmol/L   BUN 17 6 - 20 mg/dL   Creatinine, Ser 4.091.00 0.61 - 1.24 mg/dL   Glucose, Bld 87 65 - 99 mg/dL   Calcium, Ion 8.111.14 (L) 1.15 - 1.40 mmol/L   TCO2 28 0 - 100 mmol/L   Hemoglobin 15.0 13.0 - 17.0 g/dL   HCT 91.444.0 78.239.0 - 95.652.0 %    Labs Reviewed - No data to display  No results found.     ASSESSMENT & PLAN:  1. Right hip pain   You will need to go down to the emergency department for more accurate imaging of the right hip since it should have gotten better by now.  Sometimes plain x-rays fail to show a small crack in the hip bone.  No orders of the defined types were placed in this encounter.   Reviewed expectations re: course of current  medical issues. Questions answered. Outlined signs and symptoms indicating need for more acute intervention. Patient verbalized understanding. After Visit Summary given.    Procedures:      Elvina SidleLauenstein, Anuoluwapo Mefferd, MD 07/18/17 1542

## 2017-07-18 NOTE — ED Provider Notes (Signed)
MSE was initiated and I personally evaluated the patient and placed orders (if any) at  5:27 PM on July 18, 2017.  The patient appears stable so that the remainder of the MSE may be completed by another provider. Patient placed in Quick Look pathway, seen and evaluated   Chief Complaint: Hip pain, right  HPI:   Patient sent [facility] from [facility] urgent care for evaluation of the right hip.  Patient had a fall onto the right hip several days ago.  His x-ray was negative and he was given pain medication however he continues to have severe pain in the hip and is unable to bear weight.  He is pain radiating down into the right knee and pain is worse with internal/external rotation of the hip.  ROS: Hip pain (one)  Physical Exam:   Gen: No distress  Neuro: Awake and Alert  Skin: Warm    Focused Exam: No obvious deformity, pain with internal and external rotation of the hip   Initiation of care has begun. The patient has been counseled on the process, plan, and necessity for staying for the completion/evaluation, and the remainder of the medical screening examination    Arthor CaptainHarris, Vana Arif, PA-C 07/18/17 1728    Melene PlanFloyd, Dan, DO 07/18/17 2059

## 2017-07-18 NOTE — ED Triage Notes (Signed)
Pt here for left hip pain. He is not getting any better. He was seen here last week. He is unable to work.

## 2017-07-18 NOTE — Discharge Instructions (Addendum)
You will need to go down to the emergency department for more accurate imaging of the right hip since it should have gotten better by now.  Sometimes plain x-rays fail to show a small crack in the hip bone.

## 2017-07-18 NOTE — ED Triage Notes (Signed)
PT sent to ED from [facility] d/t pain in right hip after fall last week. Pt sent for further imaging since pain has not improved and pt having difficulty with ambulation

## 2017-07-19 MED ORDER — OXYCODONE-ACETAMINOPHEN 5-325 MG PO TABS
1.0000 | ORAL_TABLET | Freq: Once | ORAL | Status: AC
  Administered 2017-07-19: 1 via ORAL
  Filled 2017-07-19: qty 1

## 2017-07-19 MED ORDER — OXYCODONE-ACETAMINOPHEN 5-325 MG PO TABS: 1.0000 | ORAL_TABLET | ORAL | 0 refills | Status: DC | PRN

## 2017-07-19 NOTE — Discharge Instructions (Addendum)
°   Toe-Touch Weight Bearing (TTWB) or Touch-Down Weight Bearing (TDWB)   When you stand or walk, you may only touch the floor for balance   Do not place any body weight on your leg o Imagine there is an egg under your foot and you cannot crack it      Walkers  Many individuals utilize walkers for added stability and balance. A walker increases an individual's base of support. The most common types of walkers that you will see are a standard walker, a front wheeled walker, and a rollator.  Adjust your walker so that it fits the arms comfortably. Make sure to adjust correctly because it can reduce stress on an individual's shoulders and back.   Elbows should bend comfortably an angle of ~15 degrees   When standing inside the walker with your arms at your side, the top of the walker grip should align with the crease inside the wrist

## 2017-07-19 NOTE — ED Notes (Signed)
ED Provider at bedside. 

## 2017-07-19 NOTE — ED Notes (Signed)
ED Provider at bedside. Campos 

## 2017-07-25 DIAGNOSIS — M25551 Pain in right hip: Secondary | ICD-10-CM | POA: Diagnosis not present

## 2017-07-25 NOTE — ED Provider Notes (Signed)
[facility] EMERGENCY DEPARTMENT Provider Note   CSN: [id number] Arrival date & time: 07/18/17  1558     History   Chief Complaint No chief complaint on file.   HPI Gary Velasquez is a 57 y.o. male.  HPI 57 yo male with moderate ongoing right hip pain with ambulation since a fall 1 week ago. Fall occurred in his bedroom. Landed on hardwood floors. No head or neck injury. Pain worse with weight bearing and ambulation. Still can put some weight on right LE. Does not feel confident he would be able to stand on that leg alone. OTC pain meds attempted without improvement. [employer]. Seen at [facility] earlier today, sent to ER for further evaluation   Past Medical History:  Diagnosis Date  . Arthritis     There are no active problems to display for this patient.   History reviewed. No pertinent surgical history.      Home Medications    Prior to Admission medications   Medication Sig Start Date End Date Taking? Authorizing Provider  pantoprazole (PROTONIX) 40 MG tablet Take 40 mg by mouth daily before breakfast. 03/19/16  Yes [provider]  oxyCODONE-acetaminophen (PERCOCET/ROXICET) 5-325 MG tablet Take 1 tablet by mouth every 4 (four) hours as needed for severe pain. 07/19/17   Azalia Bilis, MD    Family History Family History  Problem Relation Age of Onset  . Hypertension Father     Social History Social History   Tobacco Use  . Smoking status: Former Smoker    Packs/day: 0.50    Years: 20.00    Pack years: 10.00    Types: Cigarettes    Last attempt to quit: 03/25/2016    Years since quitting: 1.3  . Smokeless tobacco: Current User  Substance Use Topics  . Alcohol use: No  . Drug use: Yes    Types: Marijuana     Allergies   Patient has no known allergies.   Review of Systems Review of Systems  All other systems reviewed and are negative.    Physical Exam Updated Vital Signs BP 122/62 (BP Location: Left Arm)   Pulse  74   Temp 98.3 F (36.8 C) (Oral)   Resp 16   SpO2 100%   Physical Exam  Constitutional: He is oriented to person, place, and time. He appears well-developed and well-nourished.  HENT:  Head: Normocephalic.  Eyes: EOM are normal.  Neck: Normal range of motion.  Pulmonary/Chest: Effort normal.  Abdominal: He exhibits no distension.  Musculoskeletal: Normal range of motion.  Mild pain with ROM of right hip without obvious deformity. No swelling of the RLE as compared to the left  Neurological: He is alert and oriented to person, place, and time.  Psychiatric: He has a normal mood and affect.  Nursing note and vitals reviewed.    ED Treatments / Results  Labs (all labs ordered are listed, but only abnormal results are displayed) Labs Reviewed - No data to display  EKG None  Radiology CT RIGHT HIP FINDINGS:  Bones/Joint/Cartilage: There is a nondisplaced fracture of the greater trochanter. No extension into the intertrochanteric region. No additional fracture identified. No dislocation. The right hip joint space is preserved.  Ligaments: Suboptimally assessed by CT.  Muscles and Tendons: Unremarkable.  Soft tissues: Unremarkable.  IMPRESSION: 1. Nondisplaced fracture of the greater trochanter.   Procedures Procedures (including critical care time)  Medications Ordered in ED Medications  oxyCODONE-acetaminophen (PERCOCET/ROXICET) 5-325 MG per tablet 1 tablet (  1 tablet Oral Given 07/19/17 0229)     Initial Impression / Assessment and Plan / ED Course  I have reviewed the triage vital signs and the nursing notes.  Pertinent labs & imaging results that were available during my care of the patient were reviewed by me and considered in my medical decision making (see chart for details).     Personally reviewed CT imaging. Greater trochanter fracture without evidence of extension to the intertroch region.   Discussed with Dr Eulah Pont, ortho, who recommends toe  touch weight bearing with a walker. Follow up in the office  Explained to pt. Pt has a walker at home, requests crutches for now. Pain control  Return precautions given  Final Clinical Impressions(s) / ED Diagnoses   Final diagnoses:  Closed nondisplaced fracture of greater trochanter of right femur, initial encounter [facility])    ED Discharge Orders        Ordered    oxyCODONE-acetaminophen (PERCOCET/ROXICET) 5-325 MG tablet  Every 4 hours PRN     07/19/17 0220       Azalia Bilis, MD 07/25/17 0740

## 2017-08-04 DIAGNOSIS — M25551 Pain in right hip: Secondary | ICD-10-CM | POA: Diagnosis not present

## 2017-08-18 DIAGNOSIS — M25551 Pain in right hip: Secondary | ICD-10-CM | POA: Diagnosis not present

## 2017-09-05 DIAGNOSIS — M25551 Pain in right hip: Secondary | ICD-10-CM | POA: Diagnosis not present

## 2018-10-04 ENCOUNTER — Ambulatory Visit (HOSPITAL_COMMUNITY)
Admission: EM | Admit: 2018-10-04 | Discharge: 2018-10-04 | Disposition: A | Discharge status: Home / Self Care | Payer: 59 | Attending: Family Medicine | Admitting: Family Medicine

## 2018-10-04 ENCOUNTER — Other Ambulatory Visit: Payer: Self-pay

## 2018-10-04 ENCOUNTER — Encounter (HOSPITAL_COMMUNITY): Payer: Self-pay

## 2018-10-04 DIAGNOSIS — L0291 Cutaneous abscess, unspecified: Secondary | ICD-10-CM

## 2018-10-04 DIAGNOSIS — L02212 Cutaneous abscess of back [any part, except buttock]: Secondary | ICD-10-CM | POA: Diagnosis not present

## 2018-10-04 MED ORDER — DOXYCYCLINE HYCLATE 100 MG PO CAPS: 100.0000 mg | ORAL_CAPSULE | Freq: Two times a day (BID) | ORAL | 0 refills | Status: DC

## 2018-10-04 MED ORDER — LIDOCAINE HCL 2 % IJ SOLN
INTRAMUSCULAR | Status: AC
  Filled 2018-10-04: qty 20

## 2018-10-04 NOTE — ED Triage Notes (Signed)
Pt states he has an abscess on his right shoulder. Pt states it's been there 2 weeks. Pt states it swollen and very sore.

## 2018-10-04 NOTE — ED Provider Notes (Signed)
[facility]    CSN: [id number] Arrival date & time: 10/04/18  [id number]     History   Chief Complaint Chief Complaint  Patient presents with  . Abscess    HPI Gary Velasquez is a 58 y.o. male.   Patient is a 58 year old male with no significant past medical history.  He presents today with abscess to right upper back area.  The problem has gotten worse over the last 2 weeks.  The area has become more swollen and painful.  He denies any drainage from the area.  He has not done anything to treat his symptoms.  Denies any associated fevers, chills, body aches or night sweats.  Denies any history of MRSA.  ROS per HPI      Past Medical History:  Diagnosis Date  . Arthritis     There are no active problems to display for this patient.   History reviewed. No pertinent surgical history.     Home Medications    Prior to Admission medications   Medication Sig Start Date End Date Taking? Authorizing Provider  doxycycline (VIBRAMYCIN) 100 MG capsule Take 1 capsule (100 mg total) by mouth 2 (two) times daily. 10/04/18   Dahlia ByesBast, Chucky Homes A, NP  pantoprazole (PROTONIX) 40 MG tablet Take 40 mg by mouth daily before breakfast. 03/19/16   [provider]    Family History Family History  Problem Relation Age of Onset  . Hypertension Father     Social History Social History   Tobacco Use  . Smoking status: Former Smoker    Packs/day: 0.50    Years: 20.00    Pack years: 10.00    Types: Cigarettes    Quit date: 03/25/2016    Years since quitting: 2.5  . Smokeless tobacco: Current User  Substance Use Topics  . Alcohol use: No  . Drug use: Yes    Types: Marijuana     Allergies   Patient has no known allergies.   Review of Systems Review of Systems   Physical Exam Triage Vital Signs ED Triage Vitals  Enc Vitals Group     BP 10/04/18 1020 124/90     Pulse Rate 10/04/18 1020 77     Resp 10/04/18 1020 18     Temp 10/04/18 1020 98.2 F (36.8 C)    Temp src --      SpO2 10/04/18 1020 100 %     Weight 10/04/18 1019 162 lb (73.5 kg)     Height --      Head Circumference --      Peak Flow --      Pain Score 10/04/18 1018 7     Pain Loc --      Pain Edu? --      Excl. in GC? --    No data found.  Updated Vital Signs BP 124/90 (BP Location: Right Arm)   Pulse 77   Temp 98.2 F (36.8 C)   Resp 18   Wt 162 lb (73.5 kg)   SpO2 100%   BMI 24.63 kg/m   Visual Acuity Right Eye Distance:   Left Eye Distance:   Bilateral Distance:    Right Eye Near:   Left Eye Near:    Bilateral Near:      Physical Exam  Nursing note and vitals reviewed. Constitutional: He appears well-developed and well-nourished.  HENT:  Head: Normocephalic and atraumatic.  Neck: Normal range of motion. Neck supple.  Respiratory: Effort normal.  Musculoskeletal: Normal  range of motion.  Neurological: He is alert.  Skin: Skin is warm and dry.     Approximated 6 to 7 cm abscess to right trapezius area. Surrounding erythema and induration with central fluctuance.   UC Treatments / Results  Labs (all labs ordered are listed, but only abnormal results are displayed) Labs Reviewed - No data to display  EKG   Radiology No results found.  Procedures Incision and Drainage  Date/Time: 10/04/2018 11:44 AM Performed by: Orvan July, NP Authorized by: Orvan July, NP   Consent:    Consent obtained:  Verbal   Consent given by:  Patient   Risks discussed:  Bleeding, incomplete drainage, pain and damage to other organs   Alternatives discussed:  No treatment Universal protocol:    Patient identity confirmed:  Verbally with patient Location:    Type:  Abscess Pre-procedure details:    Skin preparation:  Betadine Anesthesia (see MAR for exact dosages):    Anesthesia method:  Local infiltration   Local anesthetic:  Lidocaine 2% w/o epi Procedure type:    Complexity:  Complex Procedure details:    Needle aspiration: no     Incision  types:  Single straight   Incision depth:  Subcutaneous   Scalpel blade:  11   Wound management:  Probed and deloculated   Drainage:  Purulent and bloody   Drainage amount:  Copious   Wound treatment:  Wound left open   Packing materials:  None Post-procedure details:    Patient tolerance of procedure:  Tolerated well, no immediate complications   (including critical care time)  Medications Ordered in UC Medications  lidocaine (XYLOCAINE) 2 % (with pres) injection (has no administration in time range)    Initial Impression / Assessment and Plan / UC Course  I have reviewed the triage vital signs and the nursing notes.  Pertinent labs & imaging results that were available during my care of the patient were reviewed by me and considered in my medical decision making (see chart for details).     I&D of abscess to right trapezius area. Patient tolerated well Placed on doxycycline for antibiotic coverage Instructed to follow-up for any continued or worsening problems Final Clinical Impressions(s) / UC Diagnoses   Final diagnoses:  Abscess     Discharge Instructions     Take the antibiotic as prescribed. If the problem recurs you may want a follow-up with specialist for cyst removal.    ED Prescriptions    Medication Sig Dispense Auth. Provider   doxycycline (VIBRAMYCIN) 100 MG capsule Take 1 capsule (100 mg total) by mouth 2 (two) times daily. 20 capsule Loura Halt A, NP     Controlled Substance Prescriptions [place] Controlled Substance Registry consulted? Not Applicable   Orvan July, NP 10/04/18 1145

## 2018-10-04 NOTE — Discharge Instructions (Signed)
Take the antibiotic as prescribed. If the problem recurs you may want a follow-up with specialist for cyst removal.

## 2019-06-08 ENCOUNTER — Ambulatory Visit: Payer: 59 | Attending: Internal Medicine

## 2019-06-08 DIAGNOSIS — Z23 Encounter for immunization: Secondary | ICD-10-CM

## 2019-06-08 NOTE — Progress Notes (Signed)
   Covid-19 Vaccination Clinic  Name:  Gary Velasquez    MRN: [id number] DOB: March 10, 1961  06/08/2019  [name] was observed post Covid-19 immunization for 15 minutes without incident. He was provided with Vaccine Information Sheet and instruction to access the V-Safe system.   [name] was instructed to call 911 with any severe reactions post vaccine: Marland Kitchen Difficulty breathing  . Swelling of face and throat  . A fast heartbeat  . A bad rash all over body  . Dizziness and weakness   Immunizations Administered    Name Date Dose VIS Date Route   Pfizer COVID-19 Vaccine 06/08/2019 11:09 AM 0.3 mL 03/09/2019 Intramuscular   Manufacturer: [employer], [employer]   Lot: [id number]   NDC: [address]-2

## 2019-07-03 ENCOUNTER — Ambulatory Visit: Payer: 59 | Attending: Internal Medicine

## 2019-07-03 DIAGNOSIS — Z23 Encounter for immunization: Secondary | ICD-10-CM

## 2019-07-03 NOTE — Progress Notes (Signed)
   Covid-19 Vaccination Clinic  Name:  Gary Velasquez    MRN: [id number] DOB: 1960-09-03  07/03/2019  Gary Velasquez was observed post Covid-19 immunization for 15 minutes without incident. He was provided with Vaccine Information Sheet and instruction to access the V-Safe system.   Gary Velasquez was instructed to call 911 with any severe reactions post vaccine: Marland Kitchen Difficulty breathing  . Swelling of face and throat  . A fast heartbeat  . A bad rash all over body  . Dizziness and weakness   Immunizations Administered    Name Date Dose VIS Date Route   Pfizer COVID-19 Vaccine 07/03/2019  9:24 AM 0.3 mL 03/09/2019 Intramuscular   Manufacturer: [employer], [employer]   Lot: [id number]   NDC: [address]-2

## 2019-08-01 IMAGING — DX DG HIP (WITH OR WITHOUT PELVIS) 2-3V*R*
2 series · 2 of 2 positions shown · non-contrast
Comparison: None.

CLINICAL DATA: Fall on [REDACTED] with right hip pain. Initial
encounter.

EXAM:
DG HIP (WITH OR WITHOUT PELVIS) 2-3V RIGHT

[hip ap]
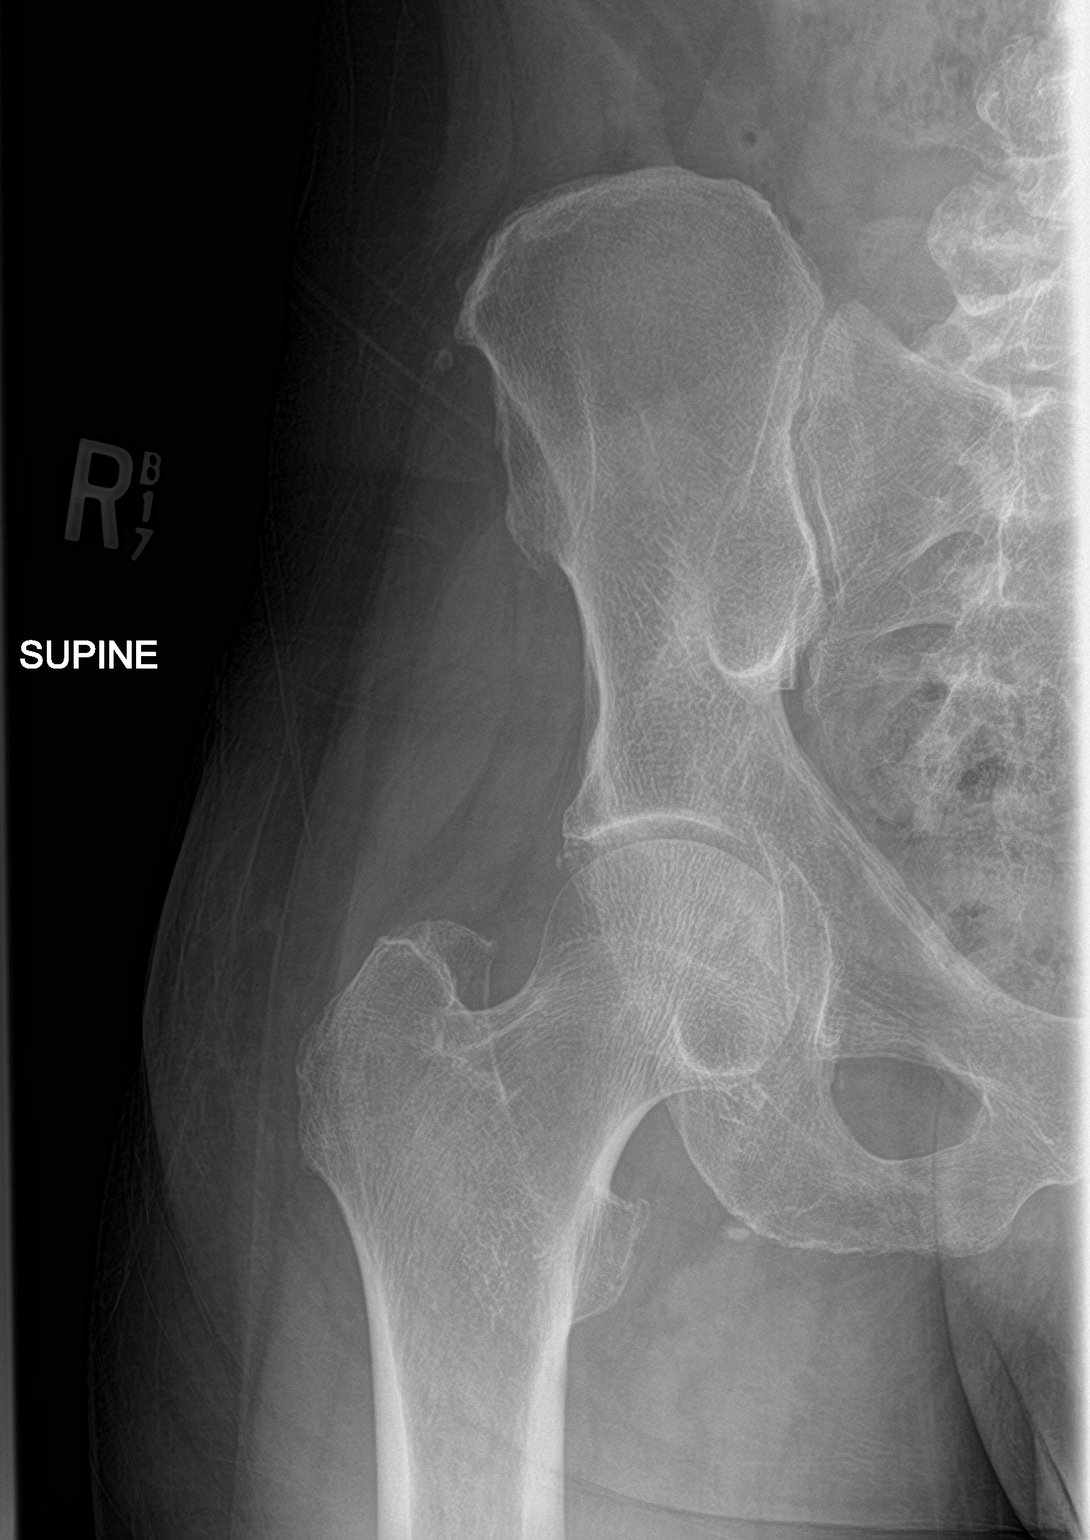

[hip lat]
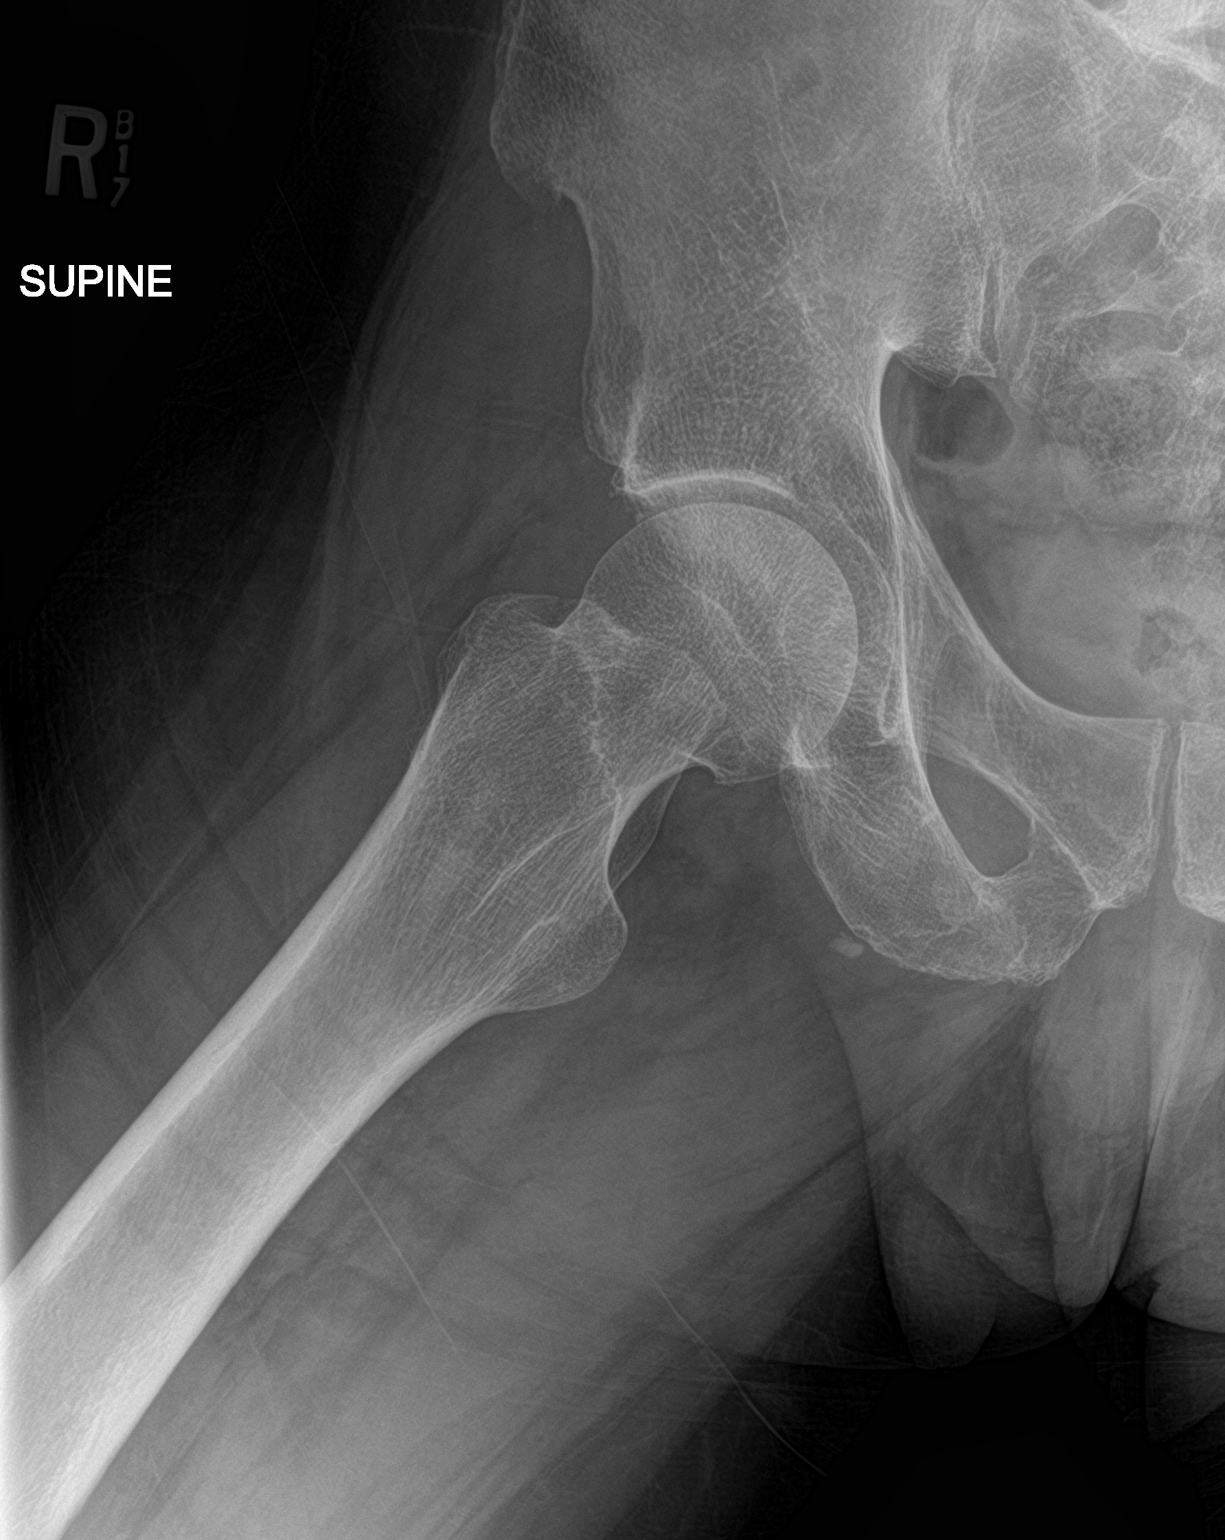

[2 of 2 positions shown; findings below may reference images not displayed]

FINDINGS: Subtle cortical lucencies at the greater trochanter as marked on the
AP and lateral views. No neck or intertrochanteric fracture is seen.
Normally located hip. Partially seen degenerative lumbar facet
spurring.
IMPRESSION: Equivocal for nondisplaced greater trochanter fracture.

## 2019-08-03 ENCOUNTER — Other Ambulatory Visit: Payer: Self-pay

## 2019-08-03 ENCOUNTER — Encounter (HOSPITAL_COMMUNITY): Payer: Self-pay

## 2019-08-03 ENCOUNTER — Ambulatory Visit (HOSPITAL_COMMUNITY)
Admission: EM | Admit: 2019-08-03 | Discharge: 2019-08-03 | Disposition: A | Discharge status: Home / Self Care | Payer: 59 | Attending: Emergency Medicine | Admitting: Emergency Medicine

## 2019-08-03 DIAGNOSIS — S0501XA Injury of conjunctiva and corneal abrasion without foreign body, right eye, initial encounter: Secondary | ICD-10-CM | POA: Diagnosis not present

## 2019-08-03 MED ORDER — ERYTHROMYCIN 5 MG/GM OP OINT: TOPICAL_OINTMENT | OPHTHALMIC | 0 refills | Status: DC

## 2019-08-03 MED ORDER — FLUORESCEIN SODIUM 1 MG OP STRP
ORAL_STRIP | OPHTHALMIC | Status: AC
  Filled 2019-08-03: qty 2

## 2019-08-03 MED ORDER — IBUPROFEN 600 MG PO TABS: 600.0000 mg | ORAL_TABLET | Freq: Four times a day (QID) | ORAL | 0 refills | Status: DC | PRN

## 2019-08-03 MED ORDER — SYSTANE 0.4-0.3 % OP SOLN: 1.0000 [drp] | Freq: Four times a day (QID) | OPHTHALMIC | 0 refills | Status: DC | PRN

## 2019-08-03 MED ORDER — OFLOXACIN 0.3 % OP SOLN: 1.0000 [drp] | Freq: Four times a day (QID) | OPHTHALMIC | 0 refills | Status: DC

## 2019-08-03 MED ORDER — HYDROCODONE-ACETAMINOPHEN 5-325 MG PO TABS: 1.0000 | ORAL_TABLET | Freq: Four times a day (QID) | ORAL | 0 refills | Status: DC | PRN

## 2019-08-03 NOTE — ED Provider Notes (Signed)
HPI  SUBJECTIVE:  Gary Velasquez is a 59 y.o. male who presents with right eye pain described as throbbing, blurry vision, foreign body sensation and increased tearing after a rock hit him in the eye.  States that he was mowing the lawn, wearing glasses, and it ricocheted off of the lawnmower into his eye.  This occurred 1 hour prior to arrival.  No photophobia, headache, nausea, vomiting.  He tried flushing his eye without improvement in his symptoms.  Symptoms are better when he opens his eye, worse with blinking or closing his eye.  His tetanus is up-to-date.  He is status post cataract surgery, states that his vision was restored to normal.  No history of diabetes, hypertension, glaucoma kidney or liver disease.  PMD: Dr. Effie Shy.  Ophthalmology: Dr. Dione Booze    Past Medical History:  Diagnosis Date  . Arthritis     History reviewed. No pertinent surgical history.  Family History  Problem Relation Age of Onset  . Hypertension Father     Social History   Tobacco Use  . Smoking status: Former Smoker    Packs/day: 0.50    Years: 20.00    Pack years: 10.00    Types: Cigarettes    Quit date: 03/25/2016    Years since quitting: 3.3  . Smokeless tobacco: Current User  Substance Use Topics  . Alcohol use: No  . Drug use: Yes    Types: Marijuana    No current facility-administered medications for this encounter.  Current Outpatient Medications:  .  erythromycin ophthalmic ointment, 1 cm ribbon to affected eyelid at night x 10 days, Disp: 5 g, Rfl: 0 .  HYDROcodone-acetaminophen (NORCO/VICODIN) 5-325 MG tablet, Take 1-2 tablets by mouth every 6 (six) hours as needed for moderate pain or severe pain., Disp: 12 tablet, Rfl: 0 .  ibuprofen (ADVIL) 600 MG tablet, Take 1 tablet (600 mg total) by mouth every 6 (six) hours as needed., Disp: 30 tablet, Rfl: 0 .  ofloxacin (OCUFLOX) 0.3 % ophthalmic solution, Place 1 drop into the right eye 4 (four) times daily. X 5 days, Disp: 5 mL, Rfl:  0 .  pantoprazole (PROTONIX) 40 MG tablet, Take 40 mg by mouth daily before breakfast., Disp: , Rfl:  .  Polyethyl Glycol-Propyl Glycol (SYSTANE) 0.4-0.3 % SOLN, Apply 1 drop to eye 4 (four) times daily as needed., Disp: 5 mL, Rfl: 0  No Known Allergies   ROS  As noted in HPI.   Physical Exam  BP (!) 163/95 (BP Location: Right Arm)   Pulse 64   Temp 98.2 F (36.8 C) (Oral)   Resp 19   SpO2 100%   Constitutional: Well developed, well nourished, no acute distress Eyes:  EOMI, PERRLA.  No evidence of trauma to the right iris.  Right conjunctival injection.  No direct or consensual photophobia.  No hyphema.  No corneal foreign body noted on magnification.  Large deep central corneal abrasion extending from the 9 to 3 o'clock position with 2 punctate lesions inferior.  No foreign body seen on lid eversion.  Negative Seidel.   Visual Acuity  Right Eye Distance:   Left Eye Distance: 20/50(Without correction. ) Bilateral Distance: 20/50(Without correction. )  Right Eye Near:  2 fingers only Left Eye Near:    Bilateral Near:    HENT: Normocephalic, atraumatic,mucus membranes moist Respiratory: Normal inspiratory effort Cardiovascular: Normal rate GI: nondistended skin: No rash, skin intact Musculoskeletal: no deformities Neurologic: Alert & oriented x 3, no focal neuro deficits  Psychiatric: Speech and behavior appropriate   ED Course   Medications - No data to display  No orders of the defined types were placed in this encounter.   No results found for this or any previous visit (from the past 24 hour(s)). No results found.  ED Clinical Impression  1. Abrasion of right cornea, initial encounter      ED Assessment/Plan  [facility] [place] [facility]arcotic database reviewed for this patient, and feel that the risk/benefit ratio today is favorable for proceeding with a prescription for controlled substance.  Last opiate prescription was in 2019.  Discussed case with Dr. Midge Aver,  ophthalmology on-call.  Recommends antibiotic eyedrops during the day, erythromycin ointment at night, ibuprofen/Tylenol.  I will also send him home with a short prescription of Norco.  Cool compresses.  Dr. Katy Fitch will see him in the office tomorrow at 10 AM.  Discussed MDM, treatment plan, and plan for follow-up with patient. Discussed sn/sx that should prompt return to the ED. patient agrees with plan.   Meds ordered this encounter  Medications  . ofloxacin (OCUFLOX) 0.3 % ophthalmic solution    Sig: Place 1 drop into the right eye 4 (four) times daily. X 5 days    Dispense:  5 mL    Refill:  0  . Polyethyl Glycol-Propyl Glycol (SYSTANE) 0.4-0.3 % SOLN    Sig: Apply 1 drop to eye 4 (four) times daily as needed.    Dispense:  5 mL    Refill:  0  . erythromycin ophthalmic ointment    Sig: 1 cm ribbon to affected eyelid at night x 10 days    Dispense:  5 g    Refill:  0  . HYDROcodone-acetaminophen (NORCO/VICODIN) 5-325 MG tablet    Sig: Take 1-2 tablets by mouth every 6 (six) hours as needed for moderate pain or severe pain.    Dispense:  12 tablet    Refill:  0  . ibuprofen (ADVIL) 600 MG tablet    Sig: Take 1 tablet (600 mg total) by mouth every 6 (six) hours as needed.    Dispense:  30 tablet    Refill:  0    *This clinic note was created using [employer]. Therefore, there may be occasional mistakes despite careful proofreading.   ?    Melynda Ripple, MD 08/05/19 1141

## 2019-08-03 NOTE — ED Triage Notes (Signed)
Pt reports he was cutting the grass 1 hr ago approx and a foreign body got into his right eye.

## 2019-08-03 NOTE — Discharge Instructions (Addendum)
Take ibuprofen combined with a Tylenol containing product 3-4 times a day as needed for pain.  Either 600 mg of ibuprofen combined with 1000 mg of plain Tylenol for mild to moderate pain or the ibuprofen with 1-2 Norco for severe pain.  Cool compresses.  Systane lubricating eyedrops.  Use the ofloxacin eyedrop during the day and the erythromycin ointment at night.  Dr. Dione Booze will see you tomorrow in his office at 10 AM.

## 2019-09-12 ENCOUNTER — Encounter (HOSPITAL_COMMUNITY): Payer: Self-pay

## 2020-09-25 ENCOUNTER — Other Ambulatory Visit: Payer: Self-pay

## 2020-09-25 ENCOUNTER — Ambulatory Visit (HOSPITAL_COMMUNITY)
Admission: EM | Admit: 2020-09-25 | Discharge: 2020-09-25 | Disposition: A | Discharge status: Home / Self Care | Payer: 59

## 2020-09-25 ENCOUNTER — Encounter (HOSPITAL_COMMUNITY): Payer: Self-pay | Admitting: Emergency Medicine

## 2020-09-25 DIAGNOSIS — M5431 Sciatica, right side: Secondary | ICD-10-CM | POA: Diagnosis not present

## 2020-09-25 HISTORY — DX: Unspecified injury of unspecified hip, initial encounter: S79.919A

## 2020-09-25 MED ORDER — DEXAMETHASONE SODIUM PHOSPHATE 10 MG/ML IJ SOLN
10.0000 mg | Freq: Once | INTRAMUSCULAR | Status: AC
  Administered 2020-09-25: 10 mg via INTRAMUSCULAR

## 2020-09-25 MED ORDER — DEXAMETHASONE SODIUM PHOSPHATE 10 MG/ML IJ SOLN
INTRAMUSCULAR | Status: AC
  Filled 2020-09-25: qty 1

## 2020-09-25 MED ORDER — PREDNISONE 20 MG PO TABS: 40.0000 mg | ORAL_TABLET | Freq: Every day | ORAL | 0 refills | Status: AC

## 2020-09-25 NOTE — ED Triage Notes (Signed)
Right lower back pain for 4 days.  Patient was bent forward when pain was first noticed.  Patient is unable to sleep due to pain.  Pain radiates down right thigh, intermittently.

## 2020-09-25 NOTE — Discharge Instructions (Addendum)
Take the prednisone daily with breakfast for the next 5 days, starting tomorrow.    Rest as much as possible Ice for 10-15 minutes every 4-6 hours as needed for pain and swelling.  You can also try heat or alternate between heat and ice.   Try some of the exercises and stretching to help with symptoms.    Return or go to the Emergency Department if symptoms worsen or do not improve in the next few days.

## 2020-09-25 NOTE — ED Notes (Signed)
Did not click completed on medication administration

## 2020-09-25 NOTE — ED Provider Notes (Signed)
[facility]    CSN: [id number] Arrival date & time: 09/25/20  1200      History   Chief Complaint Chief Complaint  Patient presents with   Back Pain    HPI Gary Velasquez is a 60 y.o. male.   Patient here for evaluation of right lower back pain that has been ongoing for the past 4 days.  Reports bending over to open a dog crate when pain started.  Reports pain is achy and constant.  Reports alternating Tylenol and ibuprofen with minimal relief.  Reports pain shoots down right leg.  Denies any loss of bowel or bladder control.  Denies any trauma, injury, or other precipitating event.  Denies any specific alleviating or aggravating factors.  Denies any fevers, chest pain, shortness of breath, N/V/D, numbness, tingling, weakness, abdominal pain, or headaches.     The history is provided by the patient.  Back Pain  Past Medical History:  Diagnosis Date   Arthritis    Hip injury     There are no problems to display for this patient.   History reviewed. No pertinent surgical history.     Home Medications    Prior to Admission medications   Medication Sig Start Date End Date Taking? Authorizing Provider  acetaminophen (TYLENOL) 325 MG tablet Take 650 mg by mouth every 6 (six) hours as needed.   Yes [provider]  ibuprofen (ADVIL) 200 MG tablet Take 200 mg by mouth every 6 (six) hours as needed.   Yes [provider]  Lidocaine 1.8 % PTCH Apply topically.   Yes [provider]  predniSONE (DELTASONE) 20 MG tablet Take 2 tablets (40 mg total) by mouth daily for 5 days. 09/25/20 09/30/20 Yes Ivette Loyal, NP    Family History Family History  Problem Relation Age of Onset   Hypertension Father     Social History Social History   Tobacco Use   Smoking status: Former    Packs/day: 0.50    Years: 20.00    Pack years: 10.00    Types: Cigarettes    Quit date: 03/25/2016    Years since quitting: 4.5   Smokeless tobacco: Current   Vaping Use   Vaping Use: Never used  Substance Use Topics   Alcohol use: No   Drug use: Yes    Types: Marijuana     Allergies   Patient has no known allergies.   Review of Systems Review of Systems  Musculoskeletal:  Positive for back pain.  All other systems reviewed and are negative.   Physical Exam Triage Vital Signs ED Triage Vitals  Enc Vitals Group     BP 09/25/20 1302 (!) 158/94     Pulse Rate 09/25/20 1302 62     Resp 09/25/20 1302 (!) 22     Temp 09/25/20 1302 99.4 F (37.4 C)     Temp Source 09/25/20 1302 Oral     SpO2 09/25/20 1302 100 %     Weight --      Height --      Head Circumference --      Peak Flow --      Pain Score 09/25/20 1257 10     Pain Loc --      Pain Edu? --      Excl. in GC? --    No data found.  Updated Vital Signs BP (!) 158/94 (BP Location: Right Arm)   Pulse 62   Temp 99.4 F (37.4  C) (Oral)   Resp (!) 22   SpO2 100%   Visual Acuity Right Eye Distance:   Left Eye Distance:   Bilateral Distance:    Right Eye Near:   Left Eye Near:    Bilateral Near:     Physical Exam Vitals and nursing note reviewed.  Constitutional:      General: He is not in acute distress.    Appearance: Normal appearance. He is not ill-appearing, toxic-appearing or diaphoretic.  HENT:     Head: Normocephalic and atraumatic.  Eyes:     Conjunctiva/sclera: Conjunctivae normal.  Cardiovascular:     Rate and Rhythm: Normal rate.     Pulses: Normal pulses.  Pulmonary:     Effort: Pulmonary effort is normal.  Abdominal:     General: Abdomen is flat.  Musculoskeletal:     Cervical back: Normal range of motion.     Lumbar back: Tenderness present. No bony tenderness. Decreased range of motion (decreased ROM due to pain). Positive right straight leg raise test. Negative left straight leg raise test.  Skin:    General: Skin is warm and dry.  Neurological:     General: No focal deficit present.     Mental Status: He is alert and oriented to  person, place, and time.  Psychiatric:        Mood and Affect: Mood normal.     UC Treatments / Results  Labs (all labs ordered are listed, but only abnormal results are displayed) Labs Reviewed - No data to display  EKG   Radiology No results found.  Procedures Procedures (including critical care time)  Medications Ordered in UC Medications  dexamethasone (DECADRON) injection 10 mg (has no administration in time range)    Initial Impression / Assessment and Plan / UC Course  I have reviewed the triage vital signs and the nursing notes.  Pertinent labs & imaging results that were available during my care of the patient were reviewed by me and considered in my medical decision making (see chart for details).    Assessment negative for red flags or concerns.  This is likely sciatica.  Decadron IM administered in office and patient may start prednisone tomorrow for the next 5 days.  Encourage fluids and rest.  May use ice, heat, or alternate between heat and ice as needed.  May continue to take Tylenol and ibuprofen as needed for pain.  Encourage exercises and gentle stretching.  Recommend following with primary care for reevaluation as soon as possible.  Go to the emergency room for any worsening symptoms. Final Clinical Impressions(s) / UC Diagnoses   Final diagnoses:  Sciatica of right side     Discharge Instructions      Take the prednisone daily with breakfast for the next 5 days, starting tomorrow.    Rest as much as possible Ice for 10-15 minutes every 4-6 hours as needed for pain and swelling.  You can also try heat or alternate between heat and ice.   Try some of the exercises and stretching to help with symptoms.    Return or go to the Emergency Department if symptoms worsen or do not improve in the next few days.      ED Prescriptions     Medication Sig Dispense Auth. Provider   predniSONE (DELTASONE) 20 MG tablet Take 2 tablets (40 mg total) by mouth  daily for 5 days. 10 tablet Ivette Loyal, NP      PDMP not reviewed this encounter.  Ivette Loyal, NP 09/25/20 [phone]

## 2021-09-30 IMAGING — CT CT HEAD W/O CM
3 series · 16 of 47 positions shown, 19 images · non-contrast
Comparison: None.

CLINICAL DATA: Unresponsive

EXAM:
CT HEAD WITHOUT CONTRAST
TECHNIQUE: Contiguous axial images were obtained from the base of the skull
through the vertex without intravenous contrast.

[Series 3: head w o · axial · 0.47mm/px · z∈[+1663,+1793]mm · 10 of 32 slices shown, 13 images]
[im 3/32  brain]
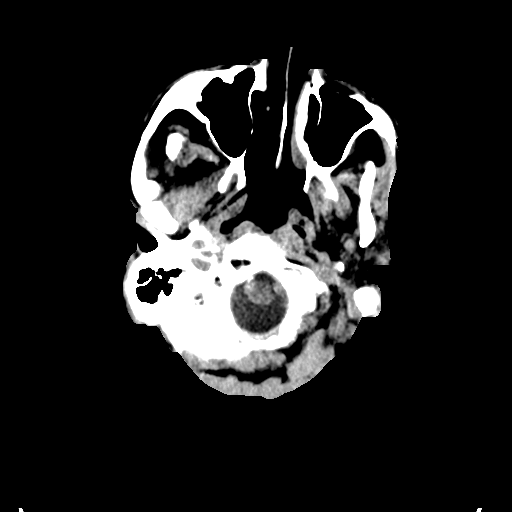
[im 3/32  bone]
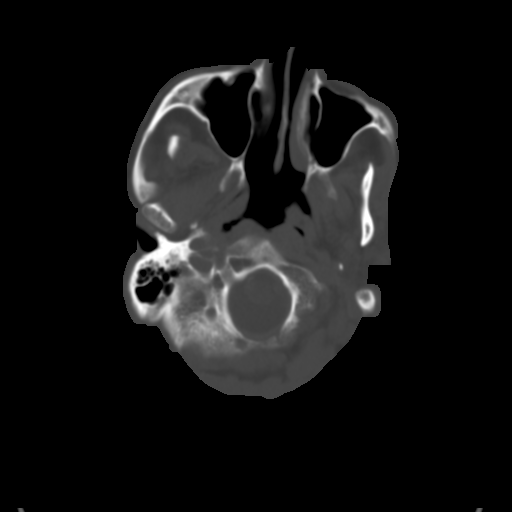
[im 6/32  brain]
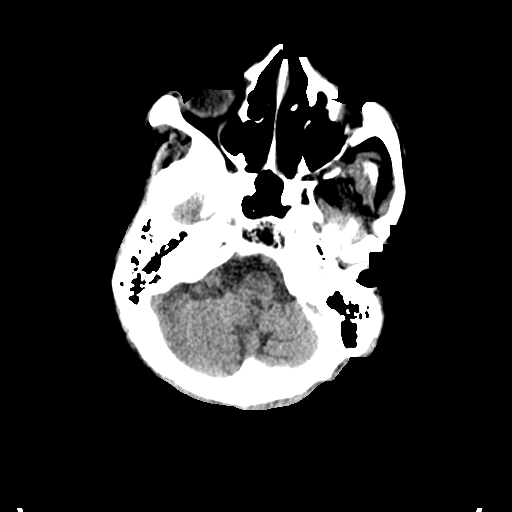
[im 9/32  brain]
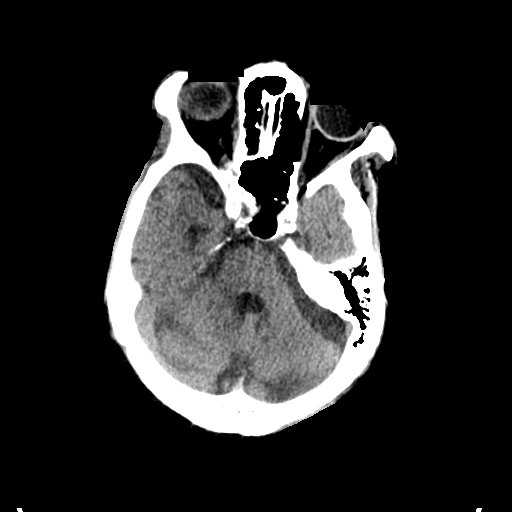
[im 11/32  brain]
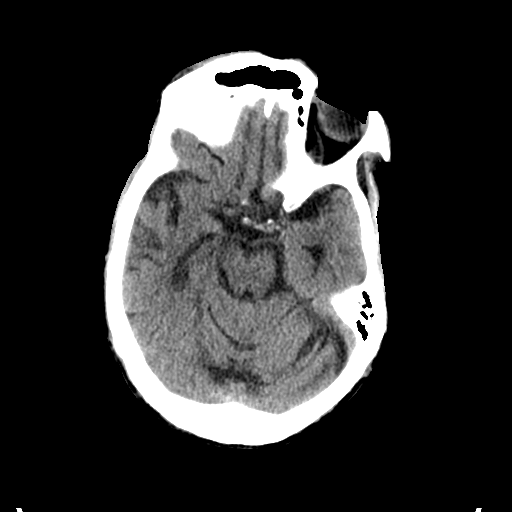
[im 14/32  brain]
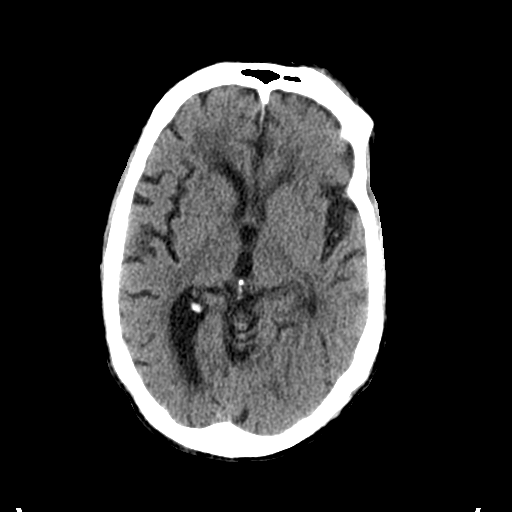
[im 14/32  bone]
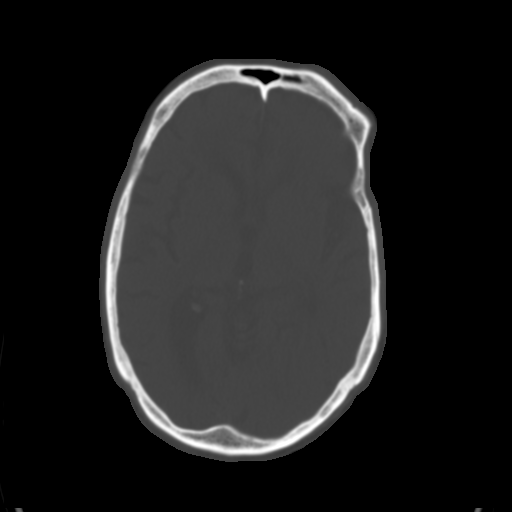
[im 18/32  brain]
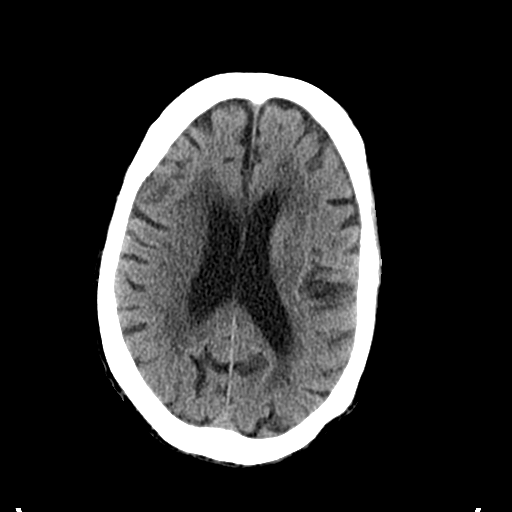
[im 21/32  brain]
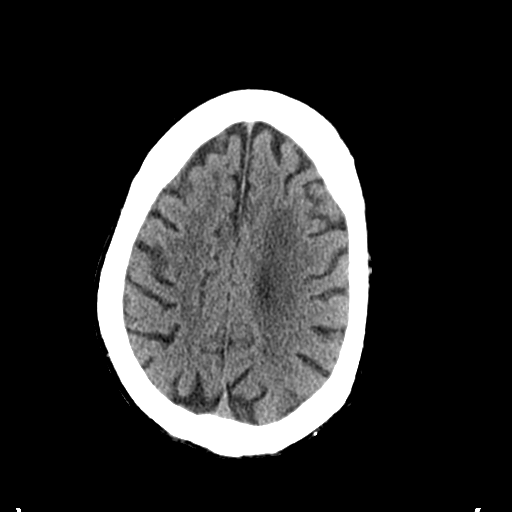
[im 24/32  brain]
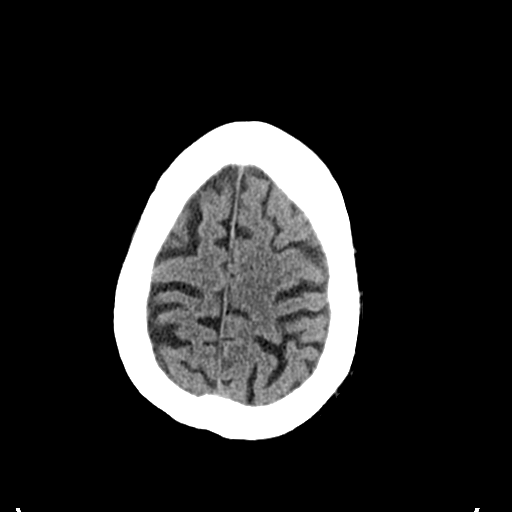
[im 26/32  brain]
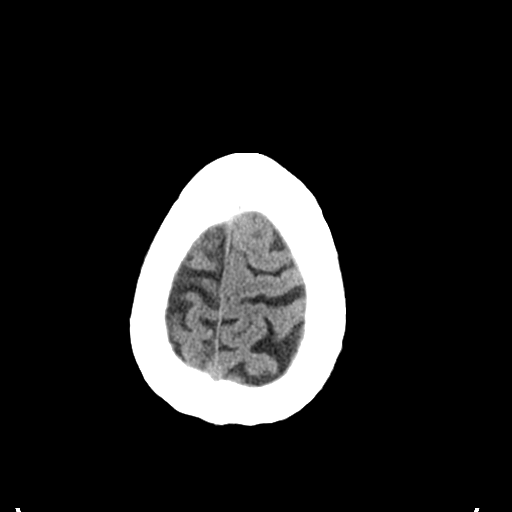
[im 26/32  bone]
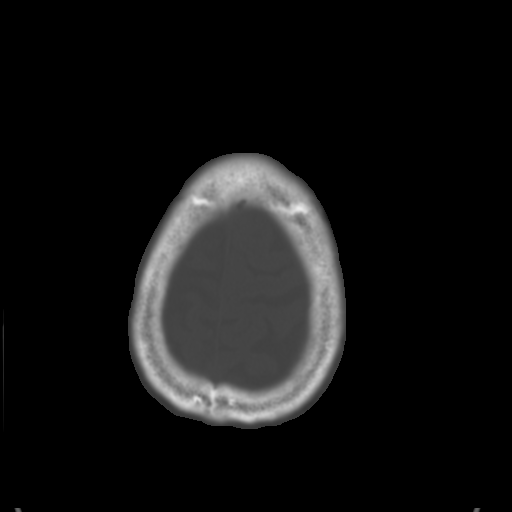
[im 29/32  brain]
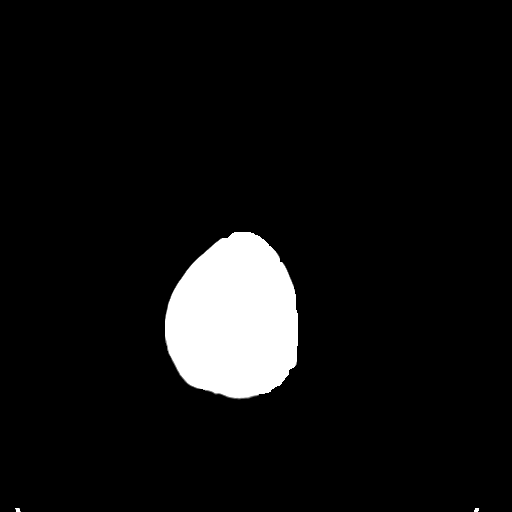

[Series 5: coronal soft · coronal · 0.34mm/px · 3 of 82 slices shown]
[im 28/82  brain]
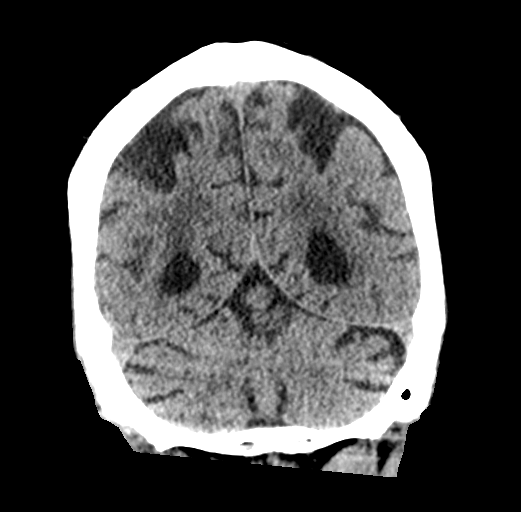
[im 37/82  brain]
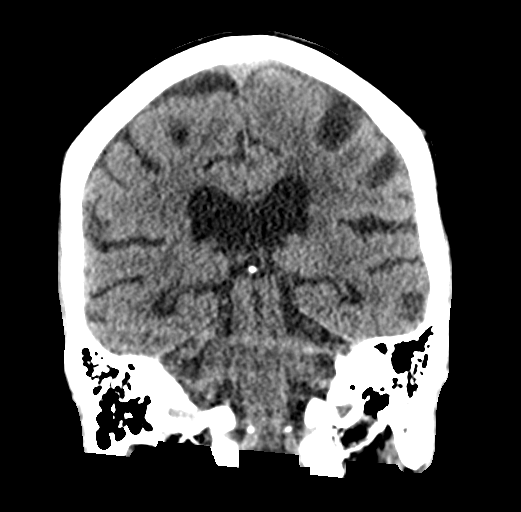
[im 46/82  brain]
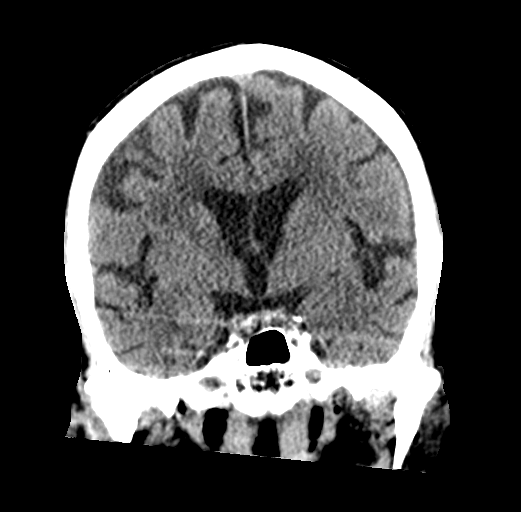

[Series 6: sagittal soft · sagittal · 0.34mm/px · 3 of 54 slices shown]
[im 18/54  brain]
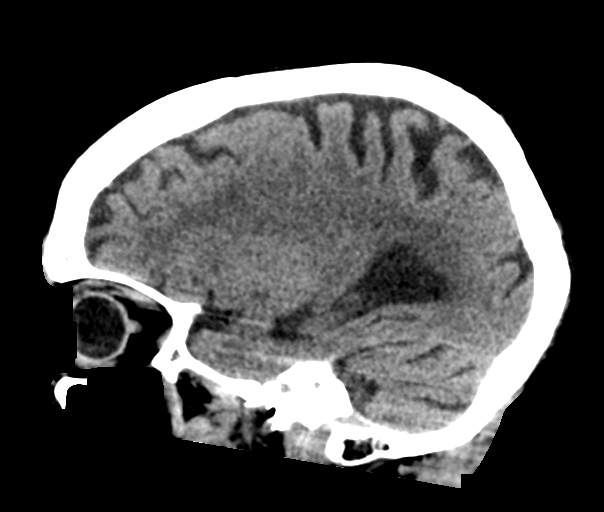
[im 27/54  brain]
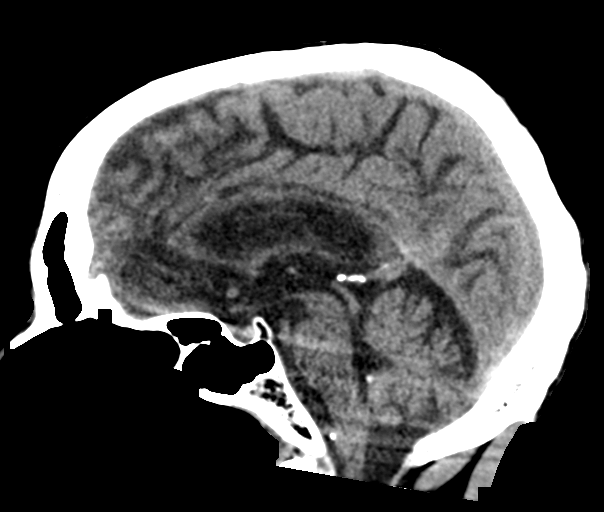
[im 36/54  brain]
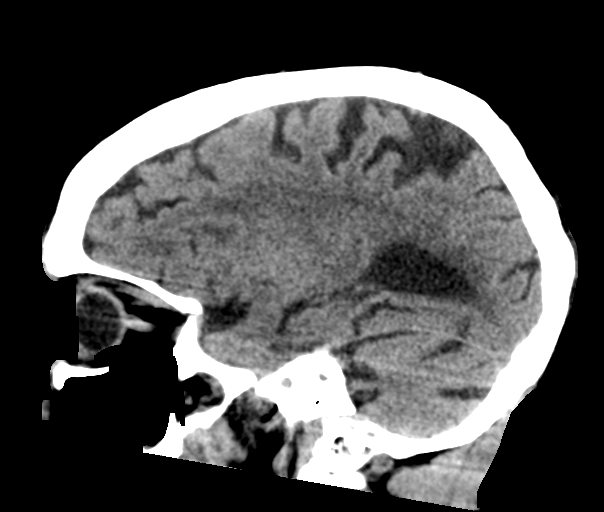

[16 of 47 positions shown; findings below may reference images not displayed]

FINDINGS: Brain: No acute territorial infarction, hemorrhage or intracranial
mass. Moderate atrophy. Moderate hypodensity in the white matter
consistent with chronic small vessel ischemic change. Nonenlarged
ventricles

Vascular: No hyperdense vessels.  Carotid vascular calcification

Skull: Normal. Negative for fracture or focal lesion.

Sinuses/Orbits: Mucosal thickening in the sinuses

Other: None
IMPRESSION: 1. No CT evidence for acute intracranial abnormality.
2. Atrophy and chronic small vessel ischemic change of the white
matter
